# Patient Record
Sex: Female | Born: 1990
Health system: Southern US, Community
[De-identification: ages and names within clinical notes are randomized; demographics above are authoritative.]

## PROBLEM LIST (undated history)

## (undated) DIAGNOSIS — F329 Major depressive disorder, single episode, unspecified: Secondary | ICD-10-CM

## (undated) DIAGNOSIS — F419 Anxiety disorder, unspecified: Secondary | ICD-10-CM

## (undated) DIAGNOSIS — F32A Depression, unspecified: Secondary | ICD-10-CM

## (undated) DIAGNOSIS — N289 Disorder of kidney and ureter, unspecified: Secondary | ICD-10-CM

## (undated) HISTORY — DX: Depression, unspecified: F32.A

## (undated) HISTORY — DX: Anxiety disorder, unspecified: F41.9

## (undated) HISTORY — DX: Major depressive disorder, single episode, unspecified: F32.9

## (undated) HISTORY — PX: WISDOM TOOTH EXTRACTION: SHX21

---
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
## 2010-05-30 NOTE — Discharge Summary (Signed)
NAME:  Barbara Figueroa, Barbara Figueroa                        ACCOUNT NO.:  [id number]   MEDICAL RECORD NO.:  [id number]                   PATIENT TYPE:  INP   LOCATION:  0105                                 FACILITY:  [facility]   PHYSICIAN:  Beverly Milch, MD                  DATE OF BIRTH:  27-Apr-1990   DATE OF ADMISSION:  10/27/2002  DATE OF DISCHARGE:  10/31/2002                                 DISCHARGE SUMMARY   IDENTIFICATION:  A [age]-year-old female, eighth grade student at  [employer] was admitted emergently voluntarily, on referral  from Dr. Milford Cage, for inpatient stabilization of suicide risk and  depression.  The patient had disclosed suicide thoughts such as using a gun  in her home or jumping from a height during her regular appointment on the  day of admission.  The patient was conflicted about being hospitalized and  having to undergo treatment as was the family, though such conflictual  problem solving has not been unique to the current setting.  For full  details please see the typed history and physical.   SYNOPSIS OF THE PRESENT ILLNESS:  The patient describes a stuttering course  of depression, having divorce counseling by Doyne Keel when she was in the  fifth grade.  She is now seeing Dr. Katrinka Blazing for one appointment and it is to  start Lexapro.  The patient suggests that she keeps all of her problems  inside and does not discuss them even with friends, stating there are few  she can really trust.  She seems to trust an older step-brother who has  diabetes.  She has more conflict with step-father than any other parent.  She appears to have a pervasive dissatisfaction and disappointment with  herself on account of her life and her future.  She presents with a pattern  of neurotic dysthymic depression with the question of a superimposed major  depression over the last 1-2 months.  She is on Ortho Tri-Cyclen but does  not describe or manifest definite side  effects including exacerbation of  dysphoria.  She is having frequent crying spells and preoccupation with  suicide.  She has irritable outbursts of anger.  She has been cutting  herself for two years since the 7th grade with a razor.  She has impulse  control difficulties such as fingernail biting, in the past which she has  stopped but continues to pop her knuckles which bothers mother and to chew  on the inside of her lip and mouth.  The mother has been on an  antidepressant for the last two years, according to the patient.  She does  not know which one.  The patient implies that biological father has been  suicidal, at the time of admission in her interview with myself but spends  every other weekend with her father.   INITIAL MENTAL STATUS EXAM:  The patient  initially was irritable and tense.  She wanted out of the hospital and became more agitated over the first 24-48  hours in this regard.  She was severely dysphoric.  Though much of this may  have been anger, and she did not open up or trust to talk to others.  She  also described a typical and hysteroid dysphoric features chronically.  Superimposed major depression was suspected from the intake interview and it  was impossible to get an elaboration from the patient on the various  implications she made about her own suicide symptom origins.   LABORATORY FINDINGS:  CBC, on admission, was normal with a white count of  5100, hemoglobin 13.3, MCV of 92, and platelet count 396,000.  Basic  metabolic panel was normal with a sodium 138, potassium 4.3, glucose 96,  creatinine 0.9 and calcium 9.8.  Hepatic function panel was normal with  total bilirubin 0.7, SGOT 18, SGPT 10 and albumin 3.8.  Free T4 was normal  at 1.12 and TSH at 2.615.  Urine HCG  was negative.  Urine drug screen was  negative.  RPR printed results are pending but provisionally negative.  Urine for GC and CT probes by DNA amplification were negative.   PHYSICAL  EXAMINATION:  GENERAL:  Completed by Sallye Lat, P.A.C.  Patient  acknowledged trying to cut her wrists last year.  She had no significant  abnormalities.  VITAL SIGNS:  Stable throughout hospital stay with admission weight of 129  and discharge weight of 130 pounds with height of 65 inches.  Blood pressure  was 110/61 and heart rate 67 at the time of discharge, after being 120/80  with heart rate of 70 at the time of admission.   HOSPITAL COURSE:  The patient started working seriously in the treatment  program on the third hospital day.  She had alienated mother on the second  hospital day who found the patient disturbing as the patient walked away and  refused to talk to mother any further.  The patient resolved some of this  behavior on the third hospital day including working with her father on the  unit as well as peers.  She became diligent then and looking at the origin  and course of depressive symptoms.  Major depressive symptoms were not  apparent in a sustained fashion once her anger was dissipated and worked  through and the more appropriate final diagnosis is that of dysthymic  disorder and impulse control disorder.  The patient resolved suicide  ideation.  She had much to tell step-father in the final family session,  though mother was motivated for the patient to be discharged to get back to  school as soon as possible, with mother considering the patient's progress  exceptional by the day of discharge.   FINAL DIAGNOSES:   AXIS I:  1. Dysthymic disorder, early onset, severe with atypical features.  2. Impulse control disorder, not otherwise specified.  3. Rule out major depressive single episode with early melancholic features     (provisional diagnosis).  4. Other specified family circumstances.  5. Parent-Child problem.   AXIS II:  Diagnosis deferred.   AXIS III:  1. Lacerations.  2. Birth control pills.  AXIS IV:  Stressors family - severe, predominantly  acute on chronic; phase  of life moderate, predominantly acute.   AXIS V:  1. Global assessment of functioning, at the time of admission, 35 with     highest last year 21 and discharge global assessment  of functioning was     55.   PLAN:  The patient was started on Lexapro 10 mg nightly during her hospital  stay and tolerated this well.  She could see the purpose of Lexapro by the  time of discharge and was committed to continuing it.  She continued her  Ortho Tri-Cyclen.  She was discharged with Lexapro 10 mg every bedtime,  quantity #30, with no refills and continue her Ortho Tri-Cyclen having her  own supply one daily.  Crisis and safety plans are outlined.  The patient  and family understand diagnosis and medication as well as ongoing need for  family therapy.   The patient will see Doyne Keel in this regard with mother to call to  arrange the appointment.  The patient will see Dr. Milford Cage on November 23, 2002 at 1400.  They will call for any interim medication difficulties  especially increased suicide ideation.                                               Beverly Milch, MD    GJ/MEDQ  D:  11/01/2002  T:  11/01/2002  Job:  [id number]   cc:   Jasmine Pang, M.D.  Fax: [phone]   Doyne Keel, Therapist  [place],  [place]

## 2010-05-30 NOTE — H&P (Signed)
NAME:  Barbara Figueroa, Barbara Figueroa                        ACCOUNT NO.:  [id number]   MEDICAL RECORD NO.:  [id number]                   PATIENT TYPE:  INP   LOCATION:  0105                                 FACILITY:  [facility]   PHYSICIAN:  Beverly Milch, MD                  DATE OF BIRTH:  06-27-1990   DATE OF ADMISSION:  10/27/2002  DATE OF DISCHARGE:                         PSYCHIATRIC ADMISSION ASSESSMENT   REPORT TITLE:  ADOLESCENT PSYCHIATRIC ADMISSION ASSESSMENT   IDENTIFICATION:  This is a 20-year-old female, eighth grade student at  [employer], who was admitted emergently voluntarily on referral  from Dr. Milford Cage for inpatient stabilization of suicide risk and  depression.  The patient acknowledged to Dr. Katrinka Blazing today that she has had  frequent and intense thoughts of suicide lately including a mechanism such  as using a gun which is in the home or jumping from a height.  The patient  is on no medications at the time of admission.   SYNOPSIS OF PRESENT ILLNESS:  The patient has a stuttering course of  depression that appears to likely represent a major depression superimposed  on a dysthymic disorder.  She also has some impulse control difficulties.  She reportedly had some divorce counseling by [employer] when she was in  the fifth grade.  She has seen Dr. Katrinka Blazing now for one appointment and Dr.  Katrinka Blazing intends to start Lexapro today.   The patient reports that her friends just treat her like an average person  although she states she does not open up and talk with anyone except people  that she can really trust who are very few.  Mother and Dr. Katrinka Blazing have been  concerned.  The patient has been cutting herself for the last two years  since the seventh grade including with a razor to the upper thigh, left  wrist, and lateral legs recently.  The patient is said to have mood swings  sometimes.  She is irritable and has easy outbursts of anger.  She throws  things at  times.  She states that she feels better when she eats and has  always used eating as a way to satiate her emotional needs.   She is crying frequently.  She has more recently had preoccupation with  suicide.  She has been much more dysphoric.  The family and professionals  are aware though the patient suggests that her friends still cannot tell.  She did not acknowledge any definite manic symptoms.  The patient has had no  psychotic symptoms.  The patient currently has specific suicide plans;  however, she does not open up and talk to many people about this, if any.   She has a significant family history of depression.  The patient will not  open up and talk much at the hospital and states she does not want to be  here.  The patient seems to feel  a sense of loss of an 21 year old brother  who may have recently moved out.  Apparently, mother has remarried and the  parents originally divorced with the patient implying that her biological  father committed suicide.  Mother is on antidepressant medication.  The  patient is on Ortho-Tri-Cyclen but does not describe or suspect that it has  exacerbated the depression.   The patient's last menses was one week ago.  The patient states she has  tried a sip of alcohol of mother's once and has smoked one cigarette.  She  has not used drugs, alcohol, or other substances of abuse, otherwise.  The  patient denies specific anxiety.  She has no psychotic symptoms.  The  patient does not acknowledge fire setting or stealing.  She does not  acknowledge hypersexuality or expansive behavior.  She has had no psychotic  symptoms.  The patient denies other trauma.   PAST MEDICAL HISTORY:  The patient is on Ortho-Tri-Cyclen and suggests that  her last GYN appointment was three months ago.  Her last menses was one week  ago.  She suggests she is, otherwise, in good general health.  She used to  bite her fingernails excessively but does not currently.  However,  she pops  her knuckles excessively which bothers mother.  She also chews on the inside  of her lip or mouth.  The patient is, otherwise, in reportedly good general  health.  The patient does not acknowledge any history of seizures or  syncope.  She has had no heart murmur or arrhythmia.  She has had no organic  central nervous system trauma.   ALLERGIES:  She has no medication allergies.   REVIEW OF SYSTEMS:  The patient denies difficulty with gait, gaze, or  continence.  She denies exposure to communicable disease or toxins.  She  denies rash, jaundice, or purpura.  There is no chest pain, palpitations or  presyncope.  There is no abdominal pain, nausea, vomiting, or diarrhea.  There is no dysuria or arthralgia.   IMMUNIZATIONS:  Are up-to-date.   FAMILY HISTORY:  The patient and mother provide little family history.  They  suggest there is an 34 year old brother who may have moved out recently  which the patient considered traumatic.  The patient apparently resides with  mother and stepfather.  The biological parents are divorced and the patient  implies that her biological father committed suicide.  She would not give  other details.  Mother has been on an antidepressant for the last two years  according to the patient but she is not certain which one.  Dr. Katrinka Blazing has  recommended Lexapro for the patient.   SOCIAL/DEVELOPMENTAL HISTORY:  There are not stated complications or  consequences of gestation, delivery, or neonatal period.  There have been no  learning delays. The patient reports that she has no effect at all from  caffeine, cold or cough medications.  She has no untoward reactions,  otherwise.  She does not smoke cigarettes except for one in the past. She  does not acknowledge purging and has no contraindication to antidepressant  treatment.   ASSETS:  The patient is intellectually capable of benefitting from  treatment.  MENTAL STATUS EXAMINATION:  The patient's  height is 65 inches and weight 129  pounds, blood pressure 120/80 and heart rate of 70.  She is alert and  oriented with speech intact.  Cranial nerves II-XII are intact.  The deep  tendon reflexes and AMRs are 0/0.  Muscle strength and tone are normal.  There are no abnormal involuntary movements.  There are no neurological soft  signs.  The patient is severely dysphoric.  She appears to attempt to hold  back tears throughout the interview.  She is guarded and defensive and  states she does not trust to talk to others.  She is somewhat irritable,  tense, and agitated.  She has chronic atypical and hysteroid dysphoric  features.  She has new onset of major depressive features.  She is currently  hopeless and helpless.  She denies anxiety.  She has impulse control  difficulties.  She does not present psychotic or dissociative features.  She  has no definite manic symptoms or hypomanic symptoms.  Her capacity for  insight and judgment is fair to poor at this time.  She has active suicidal  ideation and plan but states she can contract safety although she does not  trust anyone to talk about her problems.  She is not homicidal or  assaultive.   IMPRESSION:   AXIS I:  1. Major depression, single episode, severe with melancholic features.  2. Dysthymic disorder, early onset, moderate severity with atypical     features.  3. Impulse control disorder; not otherwise specified.  4. Other specified family circumstances.  5. Parent/child problem.   AXIS II:  Deferred.   AXIS III:  1. Lacerations.  2. Birth control pills.   AXIS IV:  Stressors:  Family:  Severe, predominantly acute and chronic.  Phase of Life:  Moderate, predominantly acute.   AXIS V:  1. Current GAF:  35.  2. Highest GAF in the last year:  84   PLAN:  The patient is admitted for inpatient adolescent psychiatric and  multidisciplinary multimodal behavioral treatment in a team based program at  a locked psychiatric  unit.  We will proceed with Lexapro per Dr. Michaelle Copas  plan.  Okay for Ortho-Tri-Cyclen at this time but will continue to monitor  and reconsider if necessary if any side effects from Ortho-Tri-Cyclen  particularly from mood or suspected.  Cognitive behavioral and family  therapy are planned.   LENGTH OF STAY:  Estimated at 5-7 days with target goals at the time of  discharge being stabilization of suicide risk and depression, restoration of  containment and communication for safety and generalization of her capacity  for safe effective outpatient treatment.                                               Beverly Milch, MD    GJ/MEDQ  D:  10/27/2002  T:  10/29/2002  Job:  [id number]

## 2013-09-11 ENCOUNTER — Encounter: Payer: Self-pay | Admitting: Obstetrics and Gynecology

## 2013-09-11 ENCOUNTER — Ambulatory Visit (INDEPENDENT_AMBULATORY_CARE_PROVIDER_SITE_OTHER): Payer: No Typology Code available for payment source | Admitting: Obstetrics and Gynecology

## 2013-09-11 VITALS — BP 100/70 | HR 76 | Resp 18 | Ht 66.0 in | Wt 146.2 lb

## 2013-09-11 DIAGNOSIS — N644 Mastodynia: Secondary | ICD-10-CM

## 2013-09-11 DIAGNOSIS — N943 Premenstrual tension syndrome: Secondary | ICD-10-CM

## 2013-09-11 MED ORDER — DROSPIRENONE-ETHINYL ESTRADIOL 3-0.02 MG PO TABS: 1.0000 | ORAL_TABLET | Freq: Every day | ORAL | Status: DC

## 2013-09-11 NOTE — Patient Instructions (Signed)
Drospirenone; Ethinyl Estradiol tablets What is this medicine? DROSPIRENONE; ETHINYL ESTRADIOL (dro SPY re nown; ETH in il es tra DYE ole) is an oral contraceptive (birth control pill). This medicine combines two types of female hormones, an estrogen and a progestin. It is used to prevent ovulation and pregnancy. This medicine may be used for other purposes; ask your health care provider or pharmacist if you have questions. COMMON BRAND NAME(S): Gianvi, Loryna, Nikki 28-Day, Ocella, Syeda, Vestura, Yasmin, Yaz, Zarah What should I tell my health care provider before I take this medicine? They need to know if you have or ever had any of these conditions: -abnormal vaginal bleeding -adrenal gland disease -blood vessel disease or blood clots -breast, cervical, endometrial, ovarian, liver, or uterine cancer -diabetes -gallbladder disease -heart disease or recent heart attack -high blood pressure -high cholesterol -high potassium level -kidney disease -liver disease -migraine headaches -stroke -systemic lupus erythematosus (SLE) -tobacco smoker -an unusual or allergic reaction to estrogens, progestins, or other medicines, foods, dyes, or preservatives -pregnant or trying to get pregnant -breast-feeding How should I use this medicine? Take this medicine by mouth. To reduce nausea, this medicine may be taken with food. Follow the directions on the prescription label. Take this medicine at the same time each day and in the order directed on the package. Do not take your medicine more often than directed. A patient package insert for the product will be given with each prescription and refill. Read this sheet carefully each time. The sheet may change frequently. Talk to your pediatrician regarding the use of this medicine in children. Special care may be needed. This medicine has been used in female children who have started having menstrual periods. Overdosage: If you think you have taken too  much of this medicine contact a poison control center or emergency room at once. NOTE: This medicine is only for you. Do not share this medicine with others. What if I miss a dose? If you miss a dose, refer to the patient information sheet you received with your medicine for direction. If you miss more than one pill, this medicine may not be as effective and you may need to use another form of birth control. What may interact with this medicine? -acetaminophen -antibiotics or medicines for infections, especially rifampin, rifabutin, rifapentine, and griseofulvin, and possibly penicillins or tetracyclines -aprepitant -ascorbic acid (vitamin C) -atorvastatin -barbiturate medicines, such as phenobarbital -bosentan -carbamazepine -caffeine -clofibrate -cyclosporine -dantrolene -doxercalciferol -felbamate -grapefruit juice -hydrocortisone -medicines for anxiety or sleeping problems, such as diazepam or temazepam -medicines for diabetes, including pioglitazone -mineral oil -modafinil -mycophenolate -nefazodone -oxcarbazepine -phenytoin -prednisolone -ritonavir or other medicines for HIV infection or AIDS -rosuvastatin -selegiline -soy isoflavones supplements -St. John's wort -tamoxifen or raloxifene -theophylline -thyroid hormones -topiramate -warfarin This product is different from other birth control pills because it contains the progestin drospirenone. Drospirenone may increase potassium levels. Interactions with other drugs may increase the chance of an elevated potassium level. You may need blood tests to check your potassium level. Drugs that can increase the potassium level include: -certain medications for high blood pressure or heart conditions (examples include ACE-inhibitors and also Angiotensin-II receptor blockers, and Eplerenone -dietary salt substitutes (these may contain potassium) -heparin -NSAIDs (antiinflammatory drugs), if they are taken long-term and daily,  like for arthritis -potassium supplements -some 'water pills' (diuretics like amiloride, spironolactone or triamterene) This list may not describe all possible interactions. Give your health care provider a list of all the medicines, herbs, non-prescription drugs, or dietary supplements   you use. Also tell them if you smoke, drink alcohol, or use illegal drugs. Some items may interact with your medicine. What should I watch for while using this medicine? Visit your doctor or health care professional for regular checks on your progress. You will need a regular breast and pelvic exam and Pap smear while on this medicine. Use an additional method of contraception during the first cycle that you take these tablets. If you have any reason to think you are pregnant, stop taking this medicine right away and contact your doctor or health care professional. If you are taking this medicine for hormone related problems, it may take several cycles of use to see improvement in your condition. Smoking increases the risk of getting a blood clot or having a stroke while you are taking birth control pills, especially if you are more than 24 years old. You are strongly advised not to smoke. This medicine can make your body retain fluid, making your fingers, hands, or ankles swell. Your blood pressure can go up. Contact your doctor or health care professional if you feel you are retaining fluid. This medicine can make you more sensitive to the sun. Keep out of the sun. If you cannot avoid being in the sun, wear protective clothing and use sunscreen. Do not use sun lamps or tanning beds/booths. If you wear contact lenses and notice visual changes, or if the lenses begin to feel uncomfortable, consult your eye care specialist. In some women, tenderness, swelling, or minor bleeding of the gums may occur. Notify your dentist if this happens. Brushing and flossing your teeth regularly may help limit this. See your dentist  regularly and inform your dentist of the medicines you are taking. If you are going to have elective surgery, you may need to stop taking this medicine before the surgery. Consult your health care professional for advice. This medicine does not protect you against HIV infection (AIDS) or any other sexually transmitted diseases. What side effects may I notice from receiving this medicine? Side effects that you should report to your doctor or health care professional as soon as possible: -allergic reactions like skin rash, itching or hives, swelling of the face, lips, or tongue -breast tissue changes or discharge -changes in vision -chest pain -confusion, trouble speaking or understanding -dark urine -general ill feeling or flu-like symptoms -light-colored stools -nausea, vomiting -pain, swelling, warmth in the leg -right upper belly pain -severe headaches -shortness of breath -sudden numbness or weakness of the face, arm or leg -trouble walking, dizziness, loss of balance or coordination -unusual vaginal bleeding -yellowing of the eyes or skin Side effects that usually do not require medical attention (report to your doctor or health care professional if they continue or are bothersome): -acne -brown spots on the face -change in appetite -change in sexual desire -depressed mood or mood swings -fluid retention and swelling -stomach cramps or bloating -unusually weak or tired -weight gain This list may not describe all possible side effects. Call your doctor for medical advice about side effects. You may report side effects to FDA at 1-800-FDA-1088. Where should I keep my medicine? Keep out of the reach of children. Store at room temperature between 15 and 30 degrees C (59 and 86 degrees F). Throw away any unused medicine after the expiration date. NOTE: This sheet is a summary. It may not cover all possible information. If you have questions about this medicine, talk to your doctor,  pharmacist, or health care provider.  2015, [employer]vier/[employer]   Standard. (2007-12-15 13:02:54)  

## 2013-09-11 NOTE — Progress Notes (Signed)
Patient ID: Barbara Figueroa, female   DOB: 12-26-1990, 23 y.o.   MRN: [id number] GYNECOLOGY VISIT  PCP:   None  Referring provider:   HPI: 23 y.o.  Single Caucasian  female   G0P0 with Patient's last menstrual period was 08/22/2013.   here for  Right Breast pain/possible lump. Woke up three days with swollen and painful breast.  Felt warm to touch.  Had a rash on her chest and it looks like it had a break out.  Resolved now.  Felt like there was knot under the breast.  Areola was pale and hurt to lift right arm. No trauma.  No prior breast problems. No nipple discharge.   Had an ultrasound of the right breast 5 - 6 years ago and was diagnosed with fibrocystic change.   No hormonal contraceptive changes.  Wants to switch to a new pill. Had been on this pill since January and now having more PMS symptoms.  Has used Yaz in the past. Worried about cystic acne.   Menses are occurring every 20 days and last 2 1/2 - 3 days.  No skipped pills "in a long time."  GYNECOLOGIC HISTORY: Patient's last menstrual period was 08/22/2013. Sexually active:  yes Partner preference: female Contraception: OCP's--Aubra   Menopausal hormone therapy: n/a DES exposure:  no  Blood transfusions:   no Sexually transmitted diseases:   no GYN procedures and prior surgeries:  no Last mammogram:   n/a              Last pap and high risk HPV testing:   12/2012 wnl History of abnormal pap smear:  no   OB History   Grav Para Term Preterm Abortions TAB SAB Ect Mult Living   0                LIFESTYLE: Exercise:   Walking and jogging            Tobacco:   no Alcohol:   1 drink per month Drug use:  no  There are no active problems to display for this patient.   Past Medical History  Diagnosis Date  . Depression     Past Surgical History  Procedure Laterality Date  . Wisdom tooth extraction      Current Outpatient Prescriptions  Medication Sig Dispense Refill  . levonorgestrel-ethinyl  estradiol (AVIANE,ALESSE,LESSINA) 0.1-20 MG-MCG tablet Take 1 tablet by mouth daily.       No current facility-administered medications for this visit.     ALLERGIES: Review of patient's allergies indicates no known allergies.  Family History  Problem Relation Age of Onset  . Breast cancer Maternal Grandmother   . Breast cancer Paternal Grandmother   . Colon cancer Paternal Grandmother     History   Social History  . Marital Status: Married    Spouse Name: N/A    Number of Children: N/A  . Years of Education: N/A   Occupational History  . Not on file.   Social History Main Topics  . Smoking status: Never Smoker   . Smokeless tobacco: Not on file  . Alcohol Use: Yes     Comment: 1 drink per month  . Drug Use: No  . Sexual Activity: Yes    Partners: Male    Birth Control/ Protection: OCP     Comment: Barbara Figueroa   Other Topics Concern  . Not on file   Social History Narrative  . No narrative on file    ROS:  Pertinent  items are noted in HPI.  PHYSICAL EXAMINATION:    BP 100/70  Pulse 76  Resp 18  Ht  (1.676 m)  Wt 146 lb 3.2 oz (66.316 kg)  BMI 23.61 kg/m2  LMP 08/22/2013   Wt Readings from Last 3 Encounters:  09/11/13 146 lb 3.2 oz (66.316 kg)     Ht Readings from Last 3 Encounters:  09/11/13  (1.676 m)    General appearance: alert, cooperative and appears stated age Head: Normocephalic, without obvious abnormality, atraumatic Lungs: clear to auscultation bilaterally Breasts: Inspection negative, No nipple retraction or dimpling, No nipple discharge or bleeding, No axillary or supraclavicular adenopathy, Normal to palpation without dominant masses Heart: regular rate and rhythm Abdomen: soft, non-tender; no masses,  no organomegaly   No abnormal inguinal nodes palpated Neurologic: Grossly normal  Pelvic:  Not needed.  ASSESSMENT  Right mastalgia.  I suspect a reaction from an insect bite.  PMS. Breakthrough bleeding on  OCPs.  PLAN  No breast imaging needed.  Switch OCPs to Yaz.  1 pack and 2 refills.  Precautions given regarding cardiovascular events.  Instructed in use.  Recheck in 3 months.  Patient will check to see when she is due for her annual exam.   An After Visit Summary was printed and given to the patient.

## 2013-12-13 ENCOUNTER — Ambulatory Visit (INDEPENDENT_AMBULATORY_CARE_PROVIDER_SITE_OTHER): Payer: No Typology Code available for payment source | Admitting: Obstetrics and Gynecology

## 2013-12-13 ENCOUNTER — Encounter: Payer: Self-pay | Admitting: Obstetrics and Gynecology

## 2013-12-13 VITALS — BP 110/68 | HR 70 | Resp 18 | Ht 66.0 in | Wt 144.0 lb

## 2013-12-13 DIAGNOSIS — Z3041 Encounter for surveillance of contraceptive pills: Secondary | ICD-10-CM

## 2013-12-13 DIAGNOSIS — N644 Mastodynia: Secondary | ICD-10-CM

## 2013-12-13 MED ORDER — DROSPIRENONE-ETHINYL ESTRADIOL 3-0.02 MG PO TABS: 1.0000 | ORAL_TABLET | Freq: Every day | ORAL | Status: DC

## 2013-12-13 NOTE — Patient Instructions (Signed)
I will see your for your annual exam in February or March!

## 2013-12-13 NOTE — Progress Notes (Signed)
Patient ID: Barbara Figueroa, female   DOB: 04-Jun-1990, 23 y.o.   MRN: [id number] GYNECOLOGY  VISIT   HPI: 23 y.o.   Married  Caucasian  female   G0P0 with Patient's last menstrual period was 11/26/2013.   here for a 3 months recheck on OCPs.  Taking Yaz for birth control and PMS symptoms.  Menses regular and monthly.  Remembers pills.  Acne improved.   Right breast is swelling right before menses.  Not as painful as it was before.  Looks red to patient.  Had a breast ultrasound 5 - 6 years ago.   GYNECOLOGIC HISTORY: Patient's last menstrual period was 11/26/2013. Contraception:    Menopausal hormone therapy:         OB History    Gravida Para Term Preterm AB TAB SAB Ectopic Multiple Living   0                  There are no active problems to display for this patient.   Past Medical History  Diagnosis Date  . Depression     Past Surgical History  Procedure Laterality Date  . Wisdom tooth extraction      Current Outpatient Prescriptions  Medication Sig Dispense Refill  . drospirenone-ethinyl estradiol (YAZ,GIANVI,LORYNA) 3-0.02 MG tablet Take 1 tablet by mouth daily. 1 Package 2   No current facility-administered medications for this visit.     ALLERGIES: Review of patient's allergies indicates no known allergies.  Family History  Problem Relation Age of Onset  . Breast cancer Maternal Grandmother   . Breast cancer Paternal Grandmother   . Colon cancer Paternal Grandmother     History   Social History  . Marital Status: Married    Spouse Name: N/A    Number of Children: N/A  . Years of Education: N/A   Occupational History  . Not on file.   Social History Main Topics  . Smoking status: Never Smoker   . Smokeless tobacco: Not on file  . Alcohol Use: Yes     Comment: 1 drink per month  . Drug Use: No  . Sexual Activity:    Partners: Male    Birth Control/ Protection: OCP     Comment: [name]   Other Topics Concern  . Not on file   Social  History Narrative    ROS:  Pertinent items are noted in HPI.  PHYSICAL EXAMINATION:    BP 110/68 mmHg  Pulse 70  Resp 18  Ht 5\' 6"  (1.676 m)  Wt 144 lb (65.318 kg)  BMI 23.25 kg/m2  LMP 11/26/2013     Breasts - no dominant masses, retractions, nipple discharge, or axillary adenopathy.   ASSESSMENT  Right breast pain.  Hx fibrocystic change.  OCP surveillance.  Doing well on Yaz.  PLAN  Right breast ultrasound.  Refill on Yaz for 4 months.  Annual exam in 2 -3 months.    An After Visit Summary was printed and given to the patient.  _15_____ minutes face to face time of which over 50% was spent in counseling.

## 2013-12-13 NOTE — Progress Notes (Signed)
Patient is scheduled for R Breast Ultrasound at [facility] imaging on 12/15/13 at 1500 . Patient agreeable to time/date/location.

## 2013-12-15 ENCOUNTER — Ambulatory Visit
Admission: RE | Admit: 2013-12-15 | Discharge: 2013-12-15 | Disposition: A | Discharge status: Home / Self Care | Payer: No Typology Code available for payment source | Source: Ambulatory Visit | Attending: Obstetrics and Gynecology | Admitting: Obstetrics and Gynecology

## 2013-12-15 DIAGNOSIS — N644 Mastodynia: Secondary | ICD-10-CM

## 2013-12-19 ENCOUNTER — Telehealth: Payer: Self-pay | Admitting: Emergency Medicine

## 2013-12-19 NOTE — Telephone Encounter (Signed)
-----   Message from [place] E Amundson de Gwenevere Ghaziarvalho E Silva, MD sent at 12/17/2013 10:21 AM EST ----- Please contact patient regarding normal right breast ultrasound and remove from mammogram hold.   Right breast pain can be managed with ibuprofen 600 mg by mouth every 8 hours.  Vitamin E 400 - 600 IU daily can also be helpful.  Continue self breast exam and return for usual annual exam and for any detected mass.  Thanks.

## 2013-12-19 NOTE — Telephone Encounter (Signed)
Patient given message from Dr. Edward JollySilva. Verbalizes understanding of instructions and when to follow up. Will try otc treatments, continue self breast exams and follow up as scheduled.  Routing to provider for final review. Patient agreeable to disposition. Will close encounter

## 2014-01-18 ENCOUNTER — Telehealth: Payer: Self-pay | Admitting: Obstetrics and Gynecology

## 2014-01-18 NOTE — Telephone Encounter (Signed)
12/21/13 #1 pack with 3 rfs [facility] Called patient she states that at the bottom of her bottle it states that it needs a MD authorization for next refill, she has a little over a week left but she just wanted to make sure her refills were okay for her to pick up. [facility] and s/w Lurena JoinerRebecca she said her next refill has been authorized and patient can pick up when ready. Called patient and notified her of this she is aware.  Routed to provider for review, encounter closed.

## 2014-01-18 NOTE — Telephone Encounter (Signed)
Patient wants to discuss her medication with the nurse. °

## 2014-02-13 ENCOUNTER — Telehealth: Payer: Self-pay | Admitting: Obstetrics and Gynecology

## 2014-02-13 NOTE — Telephone Encounter (Signed)
OK for [place] for one month.  No refills.  Patient is overdue for annual exam on chart review.  Please schedule appointment.   Thanks!

## 2014-02-13 NOTE — Telephone Encounter (Signed)
Patient wants to switch her birth control.  [facility] [address]

## 2014-02-13 NOTE — Telephone Encounter (Signed)
Patient had insurance change and Barbara Figueroa is now tier 3 on her medication coverage. Patient is not having any problems but not able to pay for tier 3 medications.  Patient states that her last brand of pills, Barbara Figueroa, is covered for her as a tier 1 medication. She has used that before without issue per patient.  Advised would send her request to Dr. Edward JollySilva for review and order if agreeable. Will return call with response from Dr. Edward JollySilva. Patient agreeable.

## 2014-02-14 MED ORDER — LEVONORGESTREL-ETHINYL ESTRAD 0.1-20 MG-MCG PO TABS: 1.0000 | ORAL_TABLET | Freq: Every day | ORAL | Status: DC

## 2014-02-14 NOTE — Telephone Encounter (Signed)
Rx for one Month of Barbara Figueroa to pharmacy. Patient has follow up appointment with Dr. Edward JollySilva 03/14/14. Will offer annual exam appointment scheduling with return call.   Call to patient, unable to leave message.

## 2014-02-14 NOTE — Telephone Encounter (Signed)
[facility]. French Anaracy unavailable and asked me to relay previous message to patient.she was agreeable to refill and will keep appt on 3/2 for 47mo recheck. last aex was 09/11/13 so she is scheduled for 9/1 with ms debbi since dr Edward Jollysilva doesnt have availability. Pt agreeable to all of this. Notified French Anaracy and ok to close encounter.

## 2014-03-14 ENCOUNTER — Ambulatory Visit (INDEPENDENT_AMBULATORY_CARE_PROVIDER_SITE_OTHER): Payer: 59 | Admitting: Obstetrics and Gynecology

## 2014-03-14 ENCOUNTER — Encounter: Payer: Self-pay | Admitting: Obstetrics and Gynecology

## 2014-03-14 VITALS — BP 100/70 | HR 72 | Resp 16 | Ht 66.0 in | Wt 150.0 lb

## 2014-03-14 DIAGNOSIS — Z113 Encounter for screening for infections with a predominantly sexual mode of transmission: Secondary | ICD-10-CM

## 2014-03-14 DIAGNOSIS — Z01419 Encounter for gynecological examination (general) (routine) without abnormal findings: Secondary | ICD-10-CM

## 2014-03-14 MED ORDER — LEVONORGESTREL-ETHINYL ESTRAD 0.1-20 MG-MCG PO TABS: 1.0000 | ORAL_TABLET | Freq: Every day | ORAL | Status: DC

## 2014-03-14 NOTE — Patient Instructions (Signed)

## 2014-03-14 NOTE — Progress Notes (Signed)
GYNECOLOGY  VISIT   HPI: 24 y.o.   Single  Caucasian  female   G0P0 with Patient's last menstrual period was 02/26/2014.   here for   Breast check. OCP Follow up.  Also due for annual exam.  Patient is now on a different OCP.  Went back to [place] about one month ago. No problems.  Right breast pain resolved.  No redness. Had a breast ultrasound 5 - 6 years ago.   Sexually active.  Desires sexually transmitted disease testing.  Uses condoms most of the time.   Exercise:  Yoga periodically.   GYNECOLOGIC HISTORY: Patient's last menstrual period was 02/26/2014. Contraception: [name] OCP   Menopausal hormone therapy: none Last pap and high risk HPV testing: 12/2012 wnl       Gardisil completed age 49 or 22. Tetanus many years ago.  Will do through PCP.   OB History    Gravida Para Term Preterm AB TAB SAB Ectopic Multiple Living   0                  There are no active problems to display for this patient.   Past Medical History  Diagnosis Date  . Depression     Past Surgical History  Procedure Laterality Date  . Wisdom tooth extraction      Current Outpatient Prescriptions  Medication Sig Dispense Refill  . levonorgestrel-ethinyl estradiol (AUBRA) 0.1-20 MG-MCG tablet Take 1 tablet by mouth daily. 1 Package 0  . lisdexamfetamine (VYVANSE) 20 MG capsule Take 20 mg by mouth daily.     No current facility-administered medications for this visit.     ALLERGIES: Review of patient's allergies indicates no known allergies.  Family History  Problem Relation Age of Onset  . Breast cancer Maternal Grandmother   . Breast cancer Paternal Grandmother   . Colon cancer Paternal Grandmother     History   Social History  . Marital Status: Single    Spouse Name: N/A  . Number of Children: N/A  . Years of Education: N/A   Occupational History  . Not on file.   Social History Main Topics  . Smoking status: Never Smoker   . Smokeless tobacco: Not on file  .  Alcohol Use: Yes     Comment: 1 drink per month  . Drug Use: No  . Sexual Activity:    Partners: Male    Birth Control/ Protection: OCP     Comment: [name]   Other Topics Concern  . Not on file   Social History Narrative    ROS:  Pertinent items are noted in HPI.  PHYSICAL EXAMINATION:    BP 100/70 mmHg  Pulse 72  Resp 16  Ht  (1.676 m)  Wt 150 lb (68.04 kg)  BMI 24.22 kg/m2  LMP 02/26/2014     General appearance: alert, cooperative and appears stated age Lungs: clear to auscultation bilaterally Heart: regular rate and rhythm Abdomen: soft, non-tender; no masses,  no organomegaly Breasts:  No dominant masses, retractions, nipple discharge, or axillary adenopathy.  No abnormal inguinal nodes palpated  Pelvic: External genitalia:  no lesions              Urethra:  normal appearing urethra with no masses, tenderness or lesions              Bartholins and Skenes: normal                 Vagina: normal appearing vagina with normal  color and discharge, no lesions              Cervix: normal appearance                   Bimanual Exam:  Uterus:  uterus is normal size, shape, consistency and nontender                                      Adnexa: normal adnexa in size, nontender and no masses                                         ASSESSMENT  Normal annual exam.  Need for contraception.  May be due for TDap.  PLAN  Refills on Aubra. 3 months with 3 refills.  Pap not needed this year.  Encouraged condom use. No STD panel here today.  Insurance encourages visits with PCP. No TDap today.  Insurance encourages visits with PCP.  Follow up yearly and prn.     An After Visit Summary was printed and given to the patient.

## 2014-09-13 ENCOUNTER — Ambulatory Visit: Payer: No Typology Code available for payment source | Admitting: Certified Nurse Midwife

## 2015-02-06 ENCOUNTER — Other Ambulatory Visit: Payer: Self-pay | Admitting: Obstetrics and Gynecology

## 2015-02-06 MED ORDER — LEVONORGESTREL-ETHINYL ESTRAD 0.1-20 MG-MCG PO TABS: 1.0000 | ORAL_TABLET | Freq: Every day | ORAL | Status: DC

## 2015-02-06 NOTE — Telephone Encounter (Signed)
Medication refill request: Barbara Figueroa Last AEX:  03/14/2014 Dr. Edward Jolly Next AEX: Not scheduled Last MMG (if hormonal medication request): 12/15/2013 USG BIRADS Category 2 Benign   Refill authorized: 03/14/2014 #3 package 3 Refills  Today:#3 package 3 Refills? Please advise

## 2015-02-06 NOTE — Telephone Encounter (Signed)
Patient requesting refill on her birth control sent to [facility] on [address] at [phone].

## 2015-05-14 ENCOUNTER — Other Ambulatory Visit: Payer: Self-pay | Admitting: Certified Nurse Midwife

## 2015-05-14 MED ORDER — LEVONORGESTREL-ETHINYL ESTRAD 0.1-20 MG-MCG PO TABS: 1.0000 | ORAL_TABLET | Freq: Every day | ORAL | Status: DC

## 2015-05-14 NOTE — Telephone Encounter (Signed)
Medication refill request: OCP Last AEX:  03/14/14 Dr. Edward JollySilva Next AEX: 06/26/15 DL Last MMG (if hormonal medication request): None Refill authorized: 02/06/15 #3packs/0R. Today #3pack/0R?

## 2015-05-14 NOTE — Telephone Encounter (Signed)
Patient called requesting refills on her birth control. Pharmacy on file is correct.

## 2015-05-15 NOTE — Telephone Encounter (Signed)
Patient aware rx has been sent to pharmacy 

## 2015-06-26 ENCOUNTER — Encounter: Payer: Self-pay | Admitting: Certified Nurse Midwife

## 2015-06-26 ENCOUNTER — Ambulatory Visit (INDEPENDENT_AMBULATORY_CARE_PROVIDER_SITE_OTHER): Payer: BLUE CROSS/BLUE SHIELD | Admitting: Certified Nurse Midwife

## 2015-06-26 VITALS — BP 104/70 | HR 72 | Resp 16 | Ht 66.25 in | Wt 175.0 lb

## 2015-06-26 DIAGNOSIS — Z23 Encounter for immunization: Secondary | ICD-10-CM | POA: Diagnosis not present

## 2015-06-26 DIAGNOSIS — Z01419 Encounter for gynecological examination (general) (routine) without abnormal findings: Secondary | ICD-10-CM

## 2015-06-26 DIAGNOSIS — Z3041 Encounter for surveillance of contraceptive pills: Secondary | ICD-10-CM

## 2015-06-26 DIAGNOSIS — Z124 Encounter for screening for malignant neoplasm of cervix: Secondary | ICD-10-CM

## 2015-06-26 MED ORDER — LEVONORGESTREL-ETHINYL ESTRAD 0.1-20 MG-MCG PO TABS: 1.0000 | ORAL_TABLET | Freq: Every day | ORAL | Status: DC

## 2015-06-26 NOTE — Progress Notes (Signed)
Reviewed personally.  M. Suzanne Camary Sosa, MD.  

## 2015-06-26 NOTE — Addendum Note (Signed)
Addended by: Michell Heinrich'NEAL, SUMMER D on: 06/26/2015 11:12 AM   Modules accepted: Kipp BroodSmartSet

## 2015-06-26 NOTE — Patient Instructions (Signed)
General topics  Next pap or exam is  due in 1 year Take a Women's multivitamin Take 1200 mg. of calcium daily - prefer dietary If any concerns in interim to call back  Breast Self-Awareness Practicing breast self-awareness may pick up problems early, prevent significant medical complications, and possibly save your life. By practicing breast self-awareness, you can become familiar with how your breasts look and feel and if your breasts are changing. This allows you to notice changes early. It can also offer you some reassurance that your breast health is good. One way to learn what is normal for your breasts and whether your breasts are changing is to do a breast self-exam. If you find a lump or something that was not present in the past, it is best to contact your caregiver right away. Other findings that should be evaluated by your caregiver include nipple discharge, especially if it is bloody; skin changes or reddening; areas where the skin seems to be pulled in (retracted); or new lumps and bumps. Breast pain is seldom associated with cancer (malignancy), but should also be evaluated by a caregiver. BREAST SELF-EXAM The best time to examine your breasts is 5 7 days after your menstrual period is over.  ExitCare Patient Information 2013 [employer].   Exercise to Stay Healthy Exercise helps you become and stay healthy. EXERCISE IDEAS AND TIPS Choose exercises that:  You enjoy.  Fit into your day. You do not need to exercise really hard to be healthy. You can do exercises at a slow or medium level and stay healthy. You can:  Stretch before and after working out.  Try yoga, Pilates, or tai chi.  Lift weights.  Walk fast, swim, jog, run, climb stairs, bicycle, dance, or rollerskate.  Take aerobic classes. Exercises that burn about 150 calories:  Running 1  miles in 15 minutes.  Playing volleyball for 45 to 60 minutes.  Washing and waxing a car for 45 to 60  minutes.  Playing touch football for 45 minutes.  Walking 1  miles in 35 minutes.  Pushing a stroller 1  miles in 30 minutes.  Playing basketball for 30 minutes.  Raking leaves for 30 minutes.  Bicycling 5 miles in 30 minutes.  Walking 2 miles in 30 minutes.  Dancing for 30 minutes.  Shoveling snow for 15 minutes.  Swimming laps for 20 minutes.  Walking up stairs for 15 minutes.  Bicycling 4 miles in 15 minutes.  Gardening for 30 to 45 minutes.  Jumping rope for 15 minutes.  Washing windows or floors for 45 to 60 minutes. Document Released: 01/31/2010 Document Revised: 03/23/2011 Document Reviewed: 01/31/2010 ExitCare Patient Information 2013 [employer]Care[employer].   Other topics ( that may be useful information):    Sexually Transmitted Disease Sexually transmitted disease (STD) refers to any infection that is passed from person to person during sexual activity. This may happen by way of saliva, semen, blood, vaginal mucus, or urine. Common STDs include:  Gonorrhea.  Chlamydia.  Syphilis.  HIV/AIDS.  Genital herpes.  Hepatitis B and C.  Trichomonas.  Human papillomavirus (HPV).  Pubic lice. CAUSES  An STD may be spread by bacteria, virus, or parasite. A person can get an STD by:  Sexual intercourse with an infected person.  Sharing sex toys with an infected person.  Sharing needles with an infected person.  Having intimate contact with the genitals, mouth, or rectal areas of an infected person. SYMPTOMS  Some people may not have any symptoms, but   they can still pass the infection to others. Different STDs have different symptoms. Symptoms include:  Painful or bloody urination.  Pain in the pelvis, abdomen, vagina, anus, throat, or eyes.  Skin rash, itching, irritation, growths, or sores (lesions). These usually occur in the genital or anal area.  Abnormal vaginal discharge.  Penile discharge in men.  Soft, flesh-colored skin growths in the  genital or anal area.  Fever.  Pain or bleeding during sexual intercourse.  Swollen glands in the groin area.  Yellow skin and eyes (jaundice). This is seen with hepatitis. DIAGNOSIS  To make a diagnosis, your caregiver may:  Take a medical history.  Perform a physical exam.  Take a specimen (culture) to be examined.  Examine a sample of discharge under a microscope.  Perform blood test TREATMENT   Chlamydia, gonorrhea, trichomonas, and syphilis can be cured with antibiotic medicine.  Genital herpes, hepatitis, and HIV can be treated, but not cured, with prescribed medicines. The medicines will lessen the symptoms.  Genital warts from HPV can be treated with medicine or by freezing, burning (electrocautery), or surgery. Warts may come back.  HPV is a virus and cannot be cured with medicine or surgery.However, abnormal areas may be followed very closely by your caregiver and may be removed from the cervix, vagina, or vulva through office procedures or surgery. If your diagnosis is confirmed, your recent sexual partners need treatment. This is true even if they are symptom-free or have a negative culture or evaluation. They should not have sex until their caregiver says it is okay. HOME CARE INSTRUCTIONS  All sexual partners should be informed, tested, and treated for all STDs.  Take your antibiotics as directed. Finish them even if you start to feel better.  Only take over-the-counter or prescription medicines for pain, discomfort, or fever as directed by your caregiver.  Rest.  Eat a balanced diet and drink enough fluids to keep your urine clear or pale yellow.  Do not have sex until treatment is completed and you have followed up with your caregiver. STDs should be checked after treatment.  Keep all follow-up appointments, Pap tests, and blood tests as directed by your caregiver.  Only use latex condoms and water-soluble lubricants during sexual activity. Do not use  petroleum jelly or oils.  Avoid alcohol and illegal drugs.  Get vaccinated for HPV and hepatitis. If you have not received these vaccines in the past, talk to your caregiver about whether one or both might be right for you.  Avoid risky sex practices that can break the skin. The only way to avoid getting an STD is to avoid all sexual activity.Latex condoms and dental dams (for oral sex) will help lessen the risk of getting an STD, but will not completely eliminate the risk. SEEK MEDICAL CARE IF:   You have a fever.  You have any new or worsening symptoms. Document Released: 03/21/2002 Document Revised: 03/23/2011 Document Reviewed: 03/28/2010 [facility] Patient Information 2013 [place].    Domestic Abuse You are being battered or abused if someone close to you hits, pushes, or physically hurts you in any way. You also are being abused if you are forced into activities. You are being sexually abused if you are forced to have sexual contact of any kind. You are being emotionally abused if you are made to feel worthless or if you are constantly threatened. It is important to remember that help is available. No one has the right to abuse you. PREVENTION OF FURTHER  ABUSE  Learn the warning signs of danger. This varies with situations but may include: the use of alcohol, threats, isolation from friends and family, or forced sexual contact. Leave if you feel that violence is going to occur.  If you are attacked or beaten, report it to the police so the abuse is documented. You do not have to press charges. The police can protect you while you or the attackers are leaving. Get the officer's name and badge number and a copy of the report.  Find someone you can trust and tell them what is happening to you: your caregiver, a nurse, clergy member, close friend or family member. Feeling ashamed is natural, but remember that you have done nothing wrong. No one deserves abuse. Document Released:  12/27/1999 Document Revised: 03/23/2011 Document Reviewed: 03/06/2010 ExitCare Patient Information 2013 [employer]Care[employer].    How Much is Too Much Alcohol? Drinking too much alcohol can cause injury, accidents, and health problems. These types of problems can include:   Car crashes.  Falls.  Family fighting (domestic violence).  Drowning.  Fights.  Injuries.  Burns.  Damage to certain organs.  Having a baby with birth defects. ONE DRINK CAN BE TOO MUCH WHEN YOU ARE:  Working.  Pregnant or breastfeeding.  Taking medicines. Ask your doctor.  Driving or planning to drive. If you or someone you know has a drinking problem, get help from a doctor.  Document Released: 10/25/2008 Document Revised: 03/23/2011 Document Reviewed: 10/25/2008 ExitCare Patient Information 2013 [employer]Care[employer].   Smoking Hazards Smoking cigarettes is extremely bad for your health. Tobacco smoke has over 200 known poisons in it. There are over 60 chemicals in tobacco smoke that cause cancer. Some of the chemicals found in cigarette smoke include:   Cyanide.  Benzene.  Formaldehyde.  Methanol (wood alcohol).  Acetylene (fuel used in welding torches).  Ammonia. Cigarette smoke also contains the poisonous gases nitrogen oxide and carbon monoxide.  Cigarette smokers have an increased risk of many serious medical problems and Smoking causes approximately:  90% of all lung cancer deaths in men.  80% of all lung cancer deaths in women.  90% of deaths from chronic obstructive lung disease. Compared with nonsmokers, smoking increases the risk of:  Coronary heart disease by 2 to 4 times.  Stroke by 2 to 4 times.  Men developing lung cancer by 23 times.  Women developing lung cancer by 13 times.  Dying from chronic obstructive lung diseases by 12 times.  . Smoking is the most preventable cause of death and disease in our society.  WHY IS SMOKING ADDICTIVE?  Nicotine is the chemical  agent in tobacco that is capable of causing addiction or dependence.  When you smoke and inhale, nicotine is absorbed rapidly into the bloodstream through your lungs. Nicotine absorbed through the lungs is capable of creating a powerful addiction. Both inhaled and non-inhaled nicotine may be addictive.  Addiction studies of cigarettes and spit tobacco show that addiction to nicotine occurs mainly during the teen years, when young people begin using tobacco products. WHAT ARE THE BENEFITS OF QUITTING?  There are many health benefits to quitting smoking.   Likelihood of developing cancer and heart disease decreases. Health improvements are seen almost immediately.  Blood pressure, pulse rate, and breathing patterns start returning to normal soon after quitting. QUITTING SMOKING   American Lung Association - 1-800-LUNGUSA  American Cancer Society - 1-800-ACS-2345 Document Released: 02/06/2004 Document Revised: 03/23/2011 Document Reviewed: 10/10/2008 ExitCare Patient Information 2013 ExitCare,   LLC.   Stress Management Stress is a state of physical or mental tension that often results from changes in your life or normal routine. Some common causes of stress are:  Death of a loved one.  Injuries or severe illnesses.  Getting fired or changing jobs.  Moving into a new home. Other causes may be:  Sexual problems.  Business or financial losses.  Taking on a large debt.  Regular conflict with someone at home or at work.  Constant tiredness from lack of sleep. It is not just bad things that are stressful. It may be stressful to:  Win the lottery.  Get married.  Buy a new car. The amount of stress that can be easily tolerated varies from person to person. Changes generally cause stress, regardless of the types of change. Too much stress can affect your health. It may lead to physical or emotional problems. Too little stress (boredom) may also become stressful. SUGGESTIONS TO  REDUCE STRESS:  Talk things over with your family and friends. It often is helpful to share your concerns and worries. If you feel your problem is serious, you may want to get help from a professional counselor.  Consider your problems one at a time instead of lumping them all together. Trying to take care of everything at once may seem impossible. List all the things you need to do and then start with the most important one. Set a goal to accomplish 2 or 3 things each day. If you expect to do too many in a single day you will naturally fail, causing you to feel even more stressed.  Do not use alcohol or drugs to relieve stress. Although you may feel better for a short time, they do not remove the problems that caused the stress. They can also be habit forming.  Exercise regularly - at least 3 times per week. Physical exercise can help to relieve that "uptight" feeling and will relax you.  The shortest distance between despair and hope is often a good night's sleep.  Go to bed and get up on time allowing yourself time for appointments without being rushed.  Take a short "time-out" period from any stressful situation that occurs during the day. Close your eyes and take some deep breaths. Starting with the muscles in your face, tense them, hold it for a few seconds, then relax. Repeat this with the muscles in your neck, shoulders, hand, stomach, back and legs.  Take good care of yourself. Eat a balanced diet and get plenty of rest.  Schedule time for having fun. Take a break from your daily routine to relax. HOME CARE INSTRUCTIONS   Call if you feel overwhelmed by your problems and feel you can no longer manage them on your own.  Return immediately if you feel like hurting yourself or someone else. Document Released: 06/24/2000 Document Revised: 03/23/2011 Document Reviewed: 02/14/2007 [facility] Patient Information 2013 [place].  Levonorgestrel intrauterine device (IUD) What is this  medicine? LEVONORGESTREL IUD (LEE voe nor jes trel) is a contraceptive (birth control) device. The device is placed inside the uterus by a healthcare professional. It is used to prevent pregnancy and can also be used to treat heavy bleeding that occurs during your period. Depending on the device, it can be used for 3 to 5 years. This medicine may be used for other purposes; ask your health care provider or pharmacist if you have questions. What should I tell my health care provider before I take this medicine?  They need to know if you have any of these conditions: -abnormal Pap smear -cancer of the breast, uterus, or cervix -diabetes -endometritis -genital or pelvic infection now or in the past -have more than one sexual partner or your partner has more than one partner -heart disease -history of an ectopic or tubal pregnancy -immune system problems -IUD in place -liver disease or tumor -problems with blood clots or take blood-thinners -use intravenous drugs -uterus of unusual shape -vaginal bleeding that has not been explained -an unusual or allergic reaction to levonorgestrel, other hormones, silicone, or polyethylene, medicines, foods, dyes, or preservatives -pregnant or trying to get pregnant -breast-feeding How should I use this medicine? This device is placed inside the uterus by a health care professional. Talk to your pediatrician regarding the use of this medicine in children. Special care may be needed. Overdosage: If you think you have taken too much of this medicine contact a poison control center or emergency room at once. NOTE: This medicine is only for you. Do not share this medicine with others. What if I miss a dose? This does not apply. What may interact with this medicine? Do not take this medicine with any of the following medications: -amprenavir -bosentan -fosamprenavir This medicine may also interact with the following medications: -aprepitant -barbiturate  medicines for inducing sleep or treating seizures -bexarotene -griseofulvin -medicines to treat seizures like carbamazepine, ethotoin, felbamate, oxcarbazepine, phenytoin, topiramate -modafinil -pioglitazone -rifabutin -rifampin -rifapentine -some medicines to treat HIV infection like atazanavir, indinavir, lopinavir, nelfinavir, tipranavir, ritonavir -St. John's wort -warfarin This list may not describe all possible interactions. Give your health care provider a list of all the medicines, herbs, non-prescription drugs, or dietary supplements you use. Also tell them if you smoke, drink alcohol, or use illegal drugs. Some items may interact with your medicine. What should I watch for while using this medicine? Visit your doctor or health care professional for regular check ups. See your doctor if you or your partner has sexual contact with others, becomes HIV positive, or gets a sexual transmitted disease. This product does not protect you against HIV infection (AIDS) or other sexually transmitted diseases. You can check the placement of the IUD yourself by reaching up to the top of your vagina with clean fingers to feel the threads. Do not pull on the threads. It is a good habit to check placement after each menstrual period. Call your doctor right away if you feel more of the IUD than just the threads or if you cannot feel the threads at all. The IUD may come out by itself. You may become pregnant if the device comes out. If you notice that the IUD has come out use a backup birth control method like condoms and call your health care provider. Using tampons will not change the position of the IUD and are okay to use during your period. What side effects may I notice from receiving this medicine? Side effects that you should report to your doctor or health care professional as soon as possible: -allergic reactions like skin rash, itching or hives, swelling of the face, lips, or tongue -fever,  flu-like symptoms -genital sores -high blood pressure -no menstrual period for 6 weeks during use -pain, swelling, warmth in the leg -pelvic pain or tenderness -severe or sudden headache -signs of pregnancy -stomach cramping -sudden shortness of breath -trouble with balance, talking, or walking -unusual vaginal bleeding, discharge -yellowing of the eyes or skin Side effects that usually do not require medical attention (  report to your doctor or health care professional if they continue or are bothersome): -acne -breast pain -change in sex drive or performance -changes in weight -cramping, dizziness, or faintness while the device is being inserted -headache -irregular menstrual bleeding within first 3 to 6 months of use -nausea This list may not describe all possible side effects. Call your doctor for medical advice about side effects. You may report side effects to FDA at 1-800-FDA-1088. Where should I keep my medicine? This does not apply. NOTE: This sheet is a summary. It may not cover all possible information. If you have questions about this medicine, talk to your doctor, pharmacist, or health care provider.    2016, Elsevier/Gold Standard. (2011-01-29 13:54:04)

## 2015-06-26 NOTE — Progress Notes (Signed)
25 y.o. G0P0 Single  Caucasian Fe here for annual exam. Periods normal, no issues.Contraception working well. Periods shorter on OCP. No partner change,no STD testing needed.  Would like to discuss IUD contraception. No other health issues today. Plans to work on weight control. Recent trip to beach!  Patient's last menstrual period was 06/13/2015.          Sexually active: Yes.    The current method of family planning is OCP (estrogen/progesterone).    Exercising: Yes.    walking & jogging occ Smoker:  no  Health Maintenance: Pap: 12/14 neg MMG:  none Colonoscopy:  none BMD:   none TDaP:   Shingles: no Pneumonia: no Hep C and HIV: not done Labs: none Self breast exam: done occ   reports that she has never smoked. She does not have any smokeless tobacco history on file. She reports that she drinks about 1.2 - 1.8 oz of alcohol per week. She reports that she does not use illicit drugs.  Past Medical History  Diagnosis Date  . Depression     Past Surgical History  Procedure Laterality Date  . Wisdom tooth extraction      Current Outpatient Prescriptions  Medication Sig Dispense Refill  . levonorgestrel-ethinyl estradiol (AUBRA) 0.1-20 MG-MCG tablet Take 1 tablet by mouth daily. 3 Package 0   No current facility-administered medications for this visit.    Family History  Problem Relation Age of Onset  . Breast cancer Maternal Grandmother   . Breast cancer Paternal Grandmother   . Colon cancer Paternal Grandmother     ROS:  Pertinent items are noted in HPI.  Otherwise, a comprehensive ROS was negative.  Exam:   BP 104/70 mmHg  Pulse 72  Resp 16  Ht 5' 6.25" (1.683 m)  Wt 175 lb (79.379 kg)  BMI 28.02 kg/m2  LMP 06/13/2015 Height: 5' 6.25" (168.3 cm) Ht Readings from Last 3 Encounters:  06/26/15 5' 6.25" (1.683 m)  03/14/14  (1.676 m)  12/13/13  (1.676 m)    General appearance: alert, cooperative and appears stated age Head: Normocephalic, without  obvious abnormality, atraumatic Neck: no adenopathy, supple, symmetrical, trachea midline and thyroid normal to inspection and palpation Lungs: clear to auscultation bilaterally Breasts: normal appearance, no masses or tenderness, No nipple retraction or dimpling, No nipple discharge or bleeding, No axillary or supraclavicular adenopathy Heart: regular rate and rhythm Abdomen: soft, non-tender; no masses,  no organomegaly Extremities: extremities normal, atraumatic, no cyanosis or edema Skin: Skin color, texture, turgor normal. No rashes or lesions Lymph nodes: Cervical, supraclavicular, and axillary nodes normal. No abnormal inguinal nodes palpated Neurologic: Grossly normal   Pelvic: External genitalia:  no lesions              Urethra:  normal appearing urethra with no masses, tenderness or lesions              Bartholin's and Skene's: normal                 Vagina: normal appearing vagina with normal color and discharge, no lesions              Cervix: no cervical motion tenderness, no lesions and nulliparous appearance              Pap taken: Yes.   Bimanual Exam:  Uterus:  normal size, contour, position, consistency, mobility, non-tender and anteverted              Adnexa: normal  adnexa and no mass, fullness, tenderness               Rectovaginal: Confirms               Anus:  normal appearance  Chaperone present: yes  A:  Well Woman with normal exam  Contraception OCP desired today  IUD contraception information  P:   Reviewed health and wellness pertinent to exam  Rx [name] see order with instructions  Discussed risks/benefits of IUD, insertion/removal/bleeding profile and long term effect. Questions addressed. Discussed will need to have inserted on period and needs to continue OCP until inserted. Patient given pamphlet and information to call for benefits of her insurance. If she decides she will call office for appointment will need MD insertion.   Pap smear as above with  HPV reflex   counseled on breast self exam, STD prevention, HIV risk factors and prevention, use and side effects of OCP's, adequate intake of calcium and vitamin D, diet and exercise  return annually or prn  An After Visit Summary was printed and given to the patient.

## 2015-06-28 LAB — IPS PAP TEST WITH REFLEX TO HPV

## 2015-12-14 DIAGNOSIS — R05 Cough: Secondary | ICD-10-CM | POA: Diagnosis not present

## 2015-12-14 DIAGNOSIS — J0110 Acute frontal sinusitis, unspecified: Secondary | ICD-10-CM | POA: Diagnosis not present

## 2016-06-22 ENCOUNTER — Encounter: Payer: Self-pay | Admitting: Physician Assistant

## 2016-06-22 ENCOUNTER — Ambulatory Visit (INDEPENDENT_AMBULATORY_CARE_PROVIDER_SITE_OTHER): Payer: BLUE CROSS/BLUE SHIELD | Admitting: Physician Assistant

## 2016-06-22 VITALS — BP 110/75 | HR 77 | Temp 98.6°F | Resp 18 | Ht 66.38 in | Wt 191.6 lb

## 2016-06-22 DIAGNOSIS — H109 Unspecified conjunctivitis: Secondary | ICD-10-CM | POA: Diagnosis not present

## 2016-06-22 DIAGNOSIS — J069 Acute upper respiratory infection, unspecified: Secondary | ICD-10-CM

## 2016-06-22 MED ORDER — ERYTHROMYCIN 5 MG/GM OP OINT: 1.0000 "application " | TOPICAL_OINTMENT | Freq: Three times a day (TID) | OPHTHALMIC | 0 refills | Status: AC

## 2016-06-22 NOTE — Patient Instructions (Addendum)
Please take ibuprofen 600mg  every 6 hours.  Continue to gargle with warm salt water.   You can take mucinex 1200mg  every 12 hours, to thin out some of this mucus build up.   cepacol throat lozenges will also help with the sore throat.       IF you received an x-ray today, you will receive an invoice from [facility]. Please contact [facility] at [phone] with questions or concerns regarding your invoice.   IF you received labwork today, you will receive an invoice from [facility]. Please contact [facility] at [phone] with questions or concerns regarding your invoice.   Our billing staff will not be able to assist you with questions regarding bills from these companies.  You will be contacted with the lab results as soon as they are available. The fastest way to get your results is to activate your My Chart account. Instructions are located on the last page of this paperwork. If you have not heard from us regarding the results in 2 weeks, please contact this office.

## 2016-06-22 NOTE — Progress Notes (Signed)
[facility] [address], [place] [phone]  Date:  06/22/2016   Name:  Barbara Figueroa   DOB:  01/16/1990   MRN:  [id number]  PCP:  Garnetta BuddyEnglish, Rufino Staup D, PA    History of Present Illness:  Barbara Figueroa is a 26 y.o. female patient who presents to PCP with  Chief Complaint  Patient presents with  . Sore Throat    on left side  . Eye Problem    X 2 days- irritations  . Sinus Problem     5 days ago, started to have aching, headache, ears clogged, and nasal congestion.  Now her sinus pressure, and right side of throat is painful.  She has no exudate that she has noticed.  Her left eye was matted, runny eye, and has the sensation of sand in eye.   She was taking nighttime cold medicine.  Gargling with salt water.   She generally has sinus allergies, but stated that it was not apparent during this time.   Coughing and scratchy throat.  Productive moreso in the morning.  Sinus pain is resolving.    There are no active problems to display for this patient.   Past Medical History:  Diagnosis Date  . Depression     Past Surgical History:  Procedure Laterality Date  . WISDOM TOOTH EXTRACTION      Social History  Substance Use Topics  . Smoking status: Never Smoker  . Smokeless tobacco: Never Used  . Alcohol use 5.4 - 6.0 oz/week    2 - 3 Standard drinks or equivalent, 3 Glasses of wine, 4 Cans of beer per week    Family History  Problem Relation Age of Onset  . Breast cancer Maternal Grandmother   . Breast cancer Paternal Grandmother   . Colon cancer Paternal Grandmother     No Known Allergies  Medication list has been reviewed and updated.  Current Outpatient Prescriptions on File Prior to Visit  Medication Sig Dispense Refill  . levonorgestrel-ethinyl estradiol (AUBRA) 0.1-20 MG-MCG tablet Take 1 tablet by mouth daily. 3 Package 4   No current facility-administered medications on file prior to visit.     ROS ROS otherwise  unremarkable unless listed above.  Physical Examination: BP 110/75 (BP Location: Right Arm, Patient Position: Sitting, Cuff Size: Large)   Pulse 77   Temp 98.6 F (37 C) (Oral)   Resp 18   Ht 5' 6.38" (1.686 m)   Wt 191 lb 9.6 oz (86.9 kg)   LMP 06/20/2016 (Exact Date)   SpO2 97%   BMI 30.57 kg/m  Ideal Body Weight: Weight in (lb) to have BMI = 25: 156.3  Physical Exam  Constitutional: She is oriented to person, place, and time. She appears well-developed and well-nourished. No distress.  HENT:  Head: Normocephalic and atraumatic.  Right Ear: Tympanic membrane, external ear and ear canal normal.  Left Ear: Tympanic membrane, external ear and ear canal normal.  Nose: Mucosal edema and rhinorrhea present. Right sinus exhibits no maxillary sinus tenderness and no frontal sinus tenderness. Left sinus exhibits no maxillary sinus tenderness and no frontal sinus tenderness.  Mouth/Throat: No uvula swelling. No oropharyngeal exudate, posterior oropharyngeal edema or posterior oropharyngeal erythema.  Eyes: EOM are normal. Pupils are equal, round, and reactive to light. Left conjunctiva is injected.  Cardiovascular: Normal rate and regular rhythm.  Exam reveals no gallop, no distant heart sounds and no friction rub.   No murmur heard. Pulmonary/Chest: Effort  normal. No respiratory distress. She has no decreased breath sounds. She has no wheezes. She has no rhonchi.  Lymphadenopathy:       Head (right side): No submandibular, no tonsillar, no preauricular and no posterior auricular adenopathy present.       Head (left side): No submandibular, no tonsillar, no preauricular and no posterior auricular adenopathy present.  Neurological: She is alert and oriented to person, place, and time.  Skin: She is not diaphoretic.  Psychiatric: She has a normal mood and affect. Her behavior is normal.     Assessment and Plan: Barbara Figueroa is a 26 y.o. female who is here today for cc of  Chief  Complaint  Patient presents with  . Sore Throat    on left side  . Eye Problem    X 2 days- irritations  . Sinus Problem  appears viral, but will treat for possible bacterial etiology of conjunctivitis Bacterial conjunctivitis - Plan: erythromycin (ROMYCIN) ophthalmic ointment  Acute upper respiratory infection  Trena Platt, PA-C [facility] 6/14/20187:53 AM

## 2016-06-26 ENCOUNTER — Ambulatory Visit: Payer: BLUE CROSS/BLUE SHIELD | Admitting: Certified Nurse Midwife

## 2016-07-08 ENCOUNTER — Ambulatory Visit (INDEPENDENT_AMBULATORY_CARE_PROVIDER_SITE_OTHER): Payer: BLUE CROSS/BLUE SHIELD | Admitting: Obstetrics and Gynecology

## 2016-07-08 ENCOUNTER — Encounter: Payer: Self-pay | Admitting: Obstetrics and Gynecology

## 2016-07-08 VITALS — BP 102/60 | HR 76 | Resp 14 | Ht 66.5 in | Wt 194.0 lb

## 2016-07-08 DIAGNOSIS — Z01419 Encounter for gynecological examination (general) (routine) without abnormal findings: Secondary | ICD-10-CM | POA: Diagnosis not present

## 2016-07-08 DIAGNOSIS — Z113 Encounter for screening for infections with a predominantly sexual mode of transmission: Secondary | ICD-10-CM

## 2016-07-08 DIAGNOSIS — Z3040 Encounter for surveillance of contraceptives, unspecified: Secondary | ICD-10-CM | POA: Diagnosis not present

## 2016-07-08 DIAGNOSIS — Z0000 Encounter for general adult medical examination without abnormal findings: Secondary | ICD-10-CM | POA: Diagnosis not present

## 2016-07-08 MED ORDER — LEVONORGESTREL-ETHINYL ESTRAD 0.1-20 MG-MCG PO TABS: 1.0000 | ORAL_TABLET | Freq: Every day | ORAL | 3 refills | Status: DC

## 2016-07-08 NOTE — Patient Instructions (Signed)

## 2016-07-08 NOTE — Progress Notes (Signed)
26 y.o. G0P0 SingleCaucasianF here for annual exam.   She is on OCP's, taking her pills consistently. She is bleeding prior to the last row, usually bleeds 1/2 was through the last row. Tolerable. Cramps are tolerable. She has been noticing more PMS recently, bloating, breast tenderness, mood changes. PMS symptoms last for about 3 days. The only other contraception she has tried is the patch.  Sexually active, same partner since February, using condoms.  Period Duration (Days): 3 days  Period Pattern: (!) Irregular Menstrual Flow: Heavy Menstrual Control: Tampon Menstrual Control Change Freq (Hours): changes tampon evry 6 hours  Dysmenorrhea: (!) Moderate Dysmenorrhea Symptoms: Cramping, Headache  Patient's last menstrual period was 06/24/2016.          Sexually active: Yes.    The current method of family planning is OCP (estrogen/progesterone).    Exercising: No.  The patient does not participate in regular exercise at present. Smoker:  no  Health Maintenance: Pap:  06/26/15 Neg  History of abnormal Pap:  no MMG:  12/15/13 BIRADS2:benign. F/u age 26  TDaP:  06-26-15 Gardasil: completed all 3    reports that she has never smoked. She has never used smokeless tobacco. She reports that she drinks about 2.4 - 3.0 oz of alcohol per week . She reports that she does not use drugs.She manages a [employer].   Past Medical History:  Diagnosis Date  . Depression     Past Surgical History:  Procedure Laterality Date  . WISDOM TOOTH EXTRACTION      Current Outpatient Prescriptions  Medication Sig Dispense Refill  . levonorgestrel-ethinyl estradiol (AUBRA) 0.1-20 MG-MCG tablet Take 1 tablet by mouth daily. 3 Package 4   No current facility-administered medications for this visit.     Family History  Problem Relation Age of Onset  . Breast cancer Maternal Grandmother   . Breast cancer Paternal Grandmother   . Colon cancer Paternal Grandmother     Review of Systems   Constitutional: Negative.   HENT: Negative.   Eyes: Negative.   Respiratory: Negative.   Cardiovascular: Negative.   Gastrointestinal: Negative.   Endocrine: Negative.   Genitourinary: Positive for menstrual problem.       Irregular menstrual cycles   Musculoskeletal: Negative.   Skin: Negative.   Allergic/Immunologic: Negative.   Neurological: Negative.   Psychiatric/Behavioral:       PMS- Moody     Exam:   BP 102/60 (BP Location: Right Arm, Patient Position: Sitting, Cuff Size: Normal)   Pulse 76   Resp 14   Ht 5' 6.5" (1.689 m)   Wt 194 lb (88 kg)   LMP 06/24/2016   BMI 30.84 kg/m   Weight change: @WEIGHTCHANGE @ Height:   Height: 5' 6.5" (168.9 cm)  Ht Readings from Last 3 Encounters:  07/08/16 5' 6.5" (1.689 m)  06/22/16 5' 6.38" (1.686 m)  06/26/15 5' 6.25" (1.683 m)    General appearance: alert, cooperative and appears stated age Head: Normocephalic, without obvious abnormality, atraumatic Neck: no adenopathy, supple, symmetrical, trachea midline and thyroid normal to inspection and palpation Lungs: clear to auscultation bilaterally Cardiovascular: regular rate and rhythm Breasts: normal appearance, no masses or tenderness Abdomen: soft, non-tender; bowel sounds normal; no masses,  no organomegaly Extremities: extremities normal, atraumatic, no cyanosis or edema Skin: Skin color, texture, turgor normal. No rashes or lesions Lymph nodes: Cervical, supraclavicular, and axillary nodes normal. No abnormal inguinal nodes palpated Neurologic: Grossly normal   Pelvic: External genitalia:  no lesions  Urethra:  normal appearing urethra with no masses, tenderness or lesions              Bartholins and Skenes: normal                 Vagina: normal appearing vagina with normal color and discharge, no lesions              Cervix: no lesions               Bimanual Exam:  Uterus:  normal size, contour, position, consistency, mobility, non-tender and  anteverted              Adnexa: no mass, fullness, tenderness               Rectovaginal: Confirms               Anus:  normal sphincter tone, no lesions  Chaperone was present for exam.  A:  Well Woman with normal exam  Mild PMS  Bleeding slightly early in her OCP pack  Screening std  P:   No pap this year  She declines a change in OCP's, will continue with the current pills  Continue to use condoms  Discussed breast self exam  Discussed calcium and vit D intake  Screening STD  Screening Labs

## 2016-07-09 LAB — LIPID PANEL
CHOL/HDL RATIO: 4.2 ratio (ref 0.0–4.4)
CHOLESTEROL TOTAL: 230 mg/dL — AB (ref 100–199)
HDL: 55 mg/dL (ref >39)
LDL CALC: 153 mg/dL — AB (ref 0–99)
TRIGLYCERIDES: 108 mg/dL (ref 0–149)
VLDL Cholesterol Cal: 22 mg/dL (ref 5–40)

## 2016-07-09 LAB — GC/CHLAMYDIA PROBE AMP
CHLAMYDIA, DNA PROBE: NEGATIVE
NEISSERIA GONORRHOEAE BY PCR: NEGATIVE

## 2016-07-09 LAB — COMPREHENSIVE METABOLIC PANEL
ALK PHOS: 69 IU/L (ref 39–117)
ALT: 10 IU/L (ref 0–32)
AST: 13 IU/L (ref 0–40)
Albumin/Globulin Ratio: 1.6 (ref 1.2–2.2)
Albumin: 4.3 g/dL (ref 3.5–5.5)
BUN/Creatinine Ratio: 17 (ref 9–23)
BUN: 13 mg/dL (ref 6–20)
Bilirubin Total: <0.2 mg/dL (ref 0.0–1.2)
CALCIUM: 9 mg/dL (ref 8.7–10.2)
CO2: 21 mmol/L (ref 20–29)
CREATININE: 0.77 mg/dL (ref 0.57–1.00)
Chloride: 104 mmol/L (ref 96–106)
GFR calc Af Amer: 122 mL/min/{1.73_m2} (ref >59)
GFR, EST NON AFRICAN AMERICAN: 106 mL/min/{1.73_m2} (ref >59)
Globulin, Total: 2.7 g/dL (ref 1.5–4.5)
Glucose: 99 mg/dL (ref 65–99)
POTASSIUM: 4.5 mmol/L (ref 3.5–5.2)
SODIUM: 142 mmol/L (ref 134–144)
Total Protein: 7 g/dL (ref 6.0–8.5)

## 2016-07-09 LAB — HSV(HERPES SIMPLEX VRS) I + II AB-IGG: HSV 1 Glycoprotein G Ab, IgG: 1.37 index — ABNORMAL HIGH (ref 0.00–0.90)

## 2016-07-09 LAB — HEPATITIS C ANTIBODY

## 2016-07-09 LAB — CBC
Hematocrit: 38.2 % (ref 34.0–46.6)
Hemoglobin: 12.9 g/dL (ref 11.1–15.9)
MCH: 31.6 pg (ref 26.6–33.0)
MCHC: 33.8 g/dL (ref 31.5–35.7)
MCV: 94 fL (ref 79–97)
PLATELETS: 387 10*3/uL — AB (ref 150–379)
RBC: 4.08 x10E6/uL (ref 3.77–5.28)
RDW: 12.2 % — AB (ref 12.3–15.4)
WBC: 6 10*3/uL (ref 3.4–10.8)

## 2016-07-09 LAB — HEP, RPR, HIV PANEL
HIV SCREEN 4TH GENERATION: NONREACTIVE
Hepatitis B Surface Ag: NEGATIVE
RPR Ser Ql: NONREACTIVE

## 2016-07-13 ENCOUNTER — Telehealth: Payer: Self-pay | Admitting: *Deleted

## 2016-07-13 NOTE — Telephone Encounter (Signed)
Left message to call regarding lab results -eh 

## 2016-07-13 NOTE — Telephone Encounter (Signed)
-----   Message from Romualdo BolkJill Evelyn Jertson, MD sent at 07/09/2016  6:22 PM EDT ----- Please let the patient know that her total cholesterol and LDL are mildly elevated. The rest of her lipid panel is normal. I would recommend regular exercise and eating a diet low in saturated fats. I would recommend checking a fasting lipid panel next year.  She has evidence of a prior HSV 1 infection (typically cold sores, but can be in the genital area). See if she has ever had cold sores or genital blisters. If she has lots of questions, you can set her up for an office visit to discuss. The rest of her lab work was normal

## 2016-07-14 NOTE — Telephone Encounter (Signed)
Patient is returning a call to Elaine. °

## 2016-07-14 NOTE — Telephone Encounter (Signed)
Spoke with patient- see results note -eh 

## 2016-08-06 ENCOUNTER — Ambulatory Visit (INDEPENDENT_AMBULATORY_CARE_PROVIDER_SITE_OTHER): Payer: BLUE CROSS/BLUE SHIELD | Admitting: Physician Assistant

## 2016-08-06 ENCOUNTER — Encounter: Payer: Self-pay | Admitting: Physician Assistant

## 2016-08-06 VITALS — BP 105/74 | HR 73 | Temp 98.2°F | Resp 18 | Ht 66.83 in | Wt 191.6 lb

## 2016-08-06 DIAGNOSIS — F329 Major depressive disorder, single episode, unspecified: Secondary | ICD-10-CM

## 2016-08-06 DIAGNOSIS — R41840 Attention and concentration deficit: Secondary | ICD-10-CM

## 2016-08-06 DIAGNOSIS — F32A Depression, unspecified: Secondary | ICD-10-CM

## 2016-08-06 MED ORDER — CITALOPRAM HYDROBROMIDE 20 MG PO TABS: 20.0000 mg | ORAL_TABLET | Freq: Every day | ORAL | 1 refills | Status: DC

## 2016-08-06 NOTE — Progress Notes (Signed)
[facility] [address], [place] [address] [phone]  Date:  08/06/2016   Name:  Barbara Figueroa   DOB:  [birth year]   MRN:  [id number]  PCP:  Garnetta BuddyEnglish, Cotey Rakes D, PA    History of Present Illness:  Barbara Figueroa is a 26 y.o. female patient who presents to PCP with  Chief Complaint  Patient presents with  . Anxiety    would like to discuss medication   . Depression    sceening was 19      --progressively over the last 2 months, she has had problems with focusing and completing task. --she also feels like her energy is low.  She is sleeping all the time, as she states, to escape her life. --she feels detached from her family, and guilty that she is staying away from her friends and family. --no si/hi.  Appetite has increased, loss of interest in doing the things she normally does, and difficulty concentrating. noticess difficulty getting out of bed.  She is falling behind at work.  [employer] and feeling behind.  She is working later.   --she feels that she is consistently worried about not getting her task done.  Work is incredibly busy.  She will be in school part-time. --stressor of her family moving away from her. --she is stressed about starting back with her schooling.  She has a hx of using vyvanse during school.  Thinks this was give to her in her 2920s for school.  She stopped this along with the lexapro, when she was getting a headache more so with the vyvanse. --she denies any hx of witnessing or part of traumatic event. --mother and sister with hx of anxiety --no family hx of thyroid disease. etOH intake is few nights per week, but not lately 3 nights per week with 1 bottle each night Tobacco or vaping: none Illicit drug use: none Sexually active: one partner unprotected. On birth control--07/18/2016  There are no active problems to display for this patient.   Past Medical History:  Diagnosis Date  . Depression     Past Surgical History:   Procedure Laterality Date  . WISDOM TOOTH EXTRACTION      Social History  Substance Use Topics  . Smoking status: Never Smoker  . Smokeless tobacco: Never Used  . Alcohol use 2.4 - 3.0 oz/week    2 - 3 Standard drinks or equivalent, 2 Glasses of wine per week    Family History  Problem Relation Age of Onset  . Breast cancer Maternal Grandmother   . Breast cancer Paternal Grandmother   . Colon cancer Paternal Grandmother     No Known Allergies  Medication list has been reviewed and updated.  Current Outpatient Prescriptions on File Prior to Visit  Medication Sig Dispense Refill  . levonorgestrel-ethinyl estradiol (AUBRA) 0.1-20 MG-MCG tablet Take 1 tablet by mouth daily. 3 Package 3   No current facility-administered medications on file prior to visit.     ROS ROS otherwise unremarkable unless listed above.  Physical Examination: BP 105/74   Pulse 73   Temp 98.2 F (36.8 C) (Oral)   Resp 18   Ht 5' 6.83" (1.697 m)   Wt 191 lb 9.6 oz (86.9 kg)   LMP 07/18/2016   SpO2 99%   BMI 30.16 kg/m  Ideal Body Weight: Weight in (lb) to have BMI = 25: 158.5  Physical Exam  Constitutional: She is oriented to person, place, and time. She  appears well-developed and well-nourished. No distress.  HENT:  Head: Normocephalic and atraumatic.  Right Ear: External ear normal.  Left Ear: External ear normal.  Eyes: Pupils are equal, round, and reactive to light. Conjunctivae and EOM are normal.  Neck: No thyromegaly present.  Cardiovascular: Normal rate.   Pulmonary/Chest: Effort normal. No respiratory distress.  Neurological: She is alert and oriented to person, place, and time.  Skin: She is not diaphoretic.  Psychiatric: She has a normal mood and affect. Her behavior is normal.     Assessment and Plan: Barbara Figueroa is a 26 y.o. female who is here today for depression and anxiety We will attempt celexa at low dose.  Difficult to determine if there is attention deficit,  or depressive features are at play.  Advised that we should treat mood, and then address the concentration piece.  This may need to be further diagnosed by an attention specialist.  Referral placed for consult In meantime follow up in 5 weeks for result of anti-depressant. Inattention - Plan: Ambulatory referral to Psychiatry  Depression, unspecified depression type - Plan: Ambulatory referral to Psychiatry  Trena PlattStephanie Diara Chaudhari, PA-C [facility] 7/27/201810:02 AM  Trena PlattStephanie Lilliemae Fruge, PA-C [facility] 7/27/201810:00 AM

## 2016-08-06 NOTE — Patient Instructions (Addendum)
I am placing a referral to attention specialist.  Please await this contact.   Listed below, are specialist I recommend.     [facility] Practition[facility]  [address]  [place], [place] [address]    Shanon RosserBarbara Farran  [phone]   Maris BergerKathy Kirstner  [phone]   Marco CollieSusan Kroll-Smith  [phone]    [facility] ([address]Beth Hulan AmatoKincaid, Ernest Beckey RutterMcCoy, Heather Joycelyn SchmidKitchens, Karla [place]) - [phone]   [facility](Julie DuckWhitt) - [phone]   [facility] - [phone]   [facility] - [phone]   Buena IrishBob Mylan - [phone]   [facility] - [phone] (do not file insurance)      IF you received an x-ray today, you will receive an invoice from [facility]. Please contact [facility] at [phone] with questions or concerns regarding your invoice.   IF you received labwork today, you will receive an invoice from [facility]. Please contact [facility] at [phone] with questions or concerns regarding your invoice.   Our billing staff will not be able to assist you with questions regarding bills from these companies.  You will be contacted with the lab results as soon as they are available. The fastest way to get your results is to activate your My Chart account. Instructions are located on the last page of this paperwork. If you have not heard from us regarding the results in 2 weeks, please contact this office.

## 2016-08-26 DIAGNOSIS — F902 Attention-deficit hyperactivity disorder, combined type: Secondary | ICD-10-CM | POA: Diagnosis not present

## 2016-09-02 DIAGNOSIS — F331 Major depressive disorder, recurrent, moderate: Secondary | ICD-10-CM | POA: Diagnosis not present

## 2016-09-02 DIAGNOSIS — F900 Attention-deficit hyperactivity disorder, predominantly inattentive type: Secondary | ICD-10-CM | POA: Diagnosis not present

## 2016-09-02 DIAGNOSIS — F411 Generalized anxiety disorder: Secondary | ICD-10-CM | POA: Diagnosis not present

## 2016-09-17 DIAGNOSIS — F4310 Post-traumatic stress disorder, unspecified: Secondary | ICD-10-CM | POA: Diagnosis not present

## 2016-09-17 DIAGNOSIS — F900 Attention-deficit hyperactivity disorder, predominantly inattentive type: Secondary | ICD-10-CM | POA: Diagnosis not present

## 2016-09-17 DIAGNOSIS — F331 Major depressive disorder, recurrent, moderate: Secondary | ICD-10-CM | POA: Diagnosis not present

## 2016-09-17 DIAGNOSIS — F411 Generalized anxiety disorder: Secondary | ICD-10-CM | POA: Diagnosis not present

## 2016-09-30 DIAGNOSIS — F900 Attention-deficit hyperactivity disorder, predominantly inattentive type: Secondary | ICD-10-CM | POA: Diagnosis not present

## 2016-09-30 DIAGNOSIS — F331 Major depressive disorder, recurrent, moderate: Secondary | ICD-10-CM | POA: Diagnosis not present

## 2016-09-30 DIAGNOSIS — F411 Generalized anxiety disorder: Secondary | ICD-10-CM | POA: Diagnosis not present

## 2016-11-26 DIAGNOSIS — F411 Generalized anxiety disorder: Secondary | ICD-10-CM | POA: Diagnosis not present

## 2016-11-26 DIAGNOSIS — F331 Major depressive disorder, recurrent, moderate: Secondary | ICD-10-CM | POA: Diagnosis not present

## 2016-11-26 DIAGNOSIS — F900 Attention-deficit hyperactivity disorder, predominantly inattentive type: Secondary | ICD-10-CM | POA: Diagnosis not present

## 2017-03-15 DIAGNOSIS — F900 Attention-deficit hyperactivity disorder, predominantly inattentive type: Secondary | ICD-10-CM | POA: Diagnosis not present

## 2017-03-15 DIAGNOSIS — F411 Generalized anxiety disorder: Secondary | ICD-10-CM | POA: Diagnosis not present

## 2017-03-15 DIAGNOSIS — F331 Major depressive disorder, recurrent, moderate: Secondary | ICD-10-CM | POA: Diagnosis not present

## 2017-04-14 ENCOUNTER — Encounter: Payer: Self-pay | Admitting: Physician Assistant

## 2017-05-02 ENCOUNTER — Other Ambulatory Visit: Payer: Self-pay | Admitting: Obstetrics and Gynecology

## 2017-05-27 ENCOUNTER — Other Ambulatory Visit: Payer: Self-pay | Admitting: Obstetrics and Gynecology

## 2017-05-27 MED ORDER — LEVONORGESTREL-ETHINYL ESTRAD 0.1-20 MG-MCG PO TABS: 1.0000 | ORAL_TABLET | Freq: Every day | ORAL | 0 refills | Status: DC

## 2017-05-27 NOTE — Telephone Encounter (Signed)
Patient called requesting refill for birth control. 

## 2017-05-27 NOTE — Telephone Encounter (Signed)
Medication refill request: Barbara Figueroa  Last AEX:  07/08/16 JJ  Next AEX: 07/14/17  Last MMG (if hormonal medication request): n/a Refill authorized: 07/08/16 3 packs, 3RF. Today, please advise.

## 2017-05-28 ENCOUNTER — Telehealth: Payer: Self-pay | Admitting: Obstetrics and Gynecology

## 2017-05-28 NOTE — Telephone Encounter (Signed)
Call placed to [facility] pharmacy. Was advised Rx for OCP Aubra received on 05/27/17, placed on hold, too soon to refill. Last filled March 2019.   Call returned to patient, advised as seen above per pharmacy. Patient states she is out of medication, asking if she can pay out of pocket. Advised patient f/u with pharmacy for options. Return call to office with any additional questions.  Routing to provider for final review. Patient is agreeable to disposition. Will close encounter.

## 2017-05-28 NOTE — Telephone Encounter (Signed)
Patient called her pharmacy and was told that her prescription was not called to her pharmacy yesterday. I confirmed the pharmacy on file. Patient is calling for the status.

## 2017-05-31 NOTE — Telephone Encounter (Signed)
Patient spoke with pharmacy on 5/17, is paying out of pocket for RX and requesting reimbursement from her insurance. Patient thankful for return call.   Routing to provider for final review. Patient is agreeable to disposition. Will close encounter.

## 2017-05-31 NOTE — Telephone Encounter (Signed)
Please confirm with the patient that she was able to get her pills. If not we need to help her. I don't want her to go without her contraception.

## 2017-06-14 DIAGNOSIS — F331 Major depressive disorder, recurrent, moderate: Secondary | ICD-10-CM | POA: Diagnosis not present

## 2017-06-14 DIAGNOSIS — F900 Attention-deficit hyperactivity disorder, predominantly inattentive type: Secondary | ICD-10-CM | POA: Diagnosis not present

## 2017-06-14 DIAGNOSIS — F411 Generalized anxiety disorder: Secondary | ICD-10-CM | POA: Diagnosis not present

## 2017-07-14 ENCOUNTER — Ambulatory Visit: Payer: BLUE CROSS/BLUE SHIELD | Admitting: Obstetrics and Gynecology

## 2017-07-14 ENCOUNTER — Encounter: Payer: Self-pay | Admitting: Obstetrics and Gynecology

## 2017-07-14 ENCOUNTER — Other Ambulatory Visit: Payer: Self-pay

## 2017-07-14 VITALS — BP 105/76 | HR 62 | Ht 66.0 in | Wt 161.6 lb

## 2017-07-14 DIAGNOSIS — Z0000 Encounter for general adult medical examination without abnormal findings: Secondary | ICD-10-CM | POA: Diagnosis not present

## 2017-07-14 DIAGNOSIS — Z01419 Encounter for gynecological examination (general) (routine) without abnormal findings: Secondary | ICD-10-CM | POA: Diagnosis not present

## 2017-07-14 DIAGNOSIS — Z23 Encounter for immunization: Secondary | ICD-10-CM | POA: Diagnosis not present

## 2017-07-14 MED ORDER — LEVONORGESTREL-ETHINYL ESTRAD 0.1-20 MG-MCG PO TABS: 1.0000 | ORAL_TABLET | Freq: Every day | ORAL | 3 refills | Status: DC

## 2017-07-14 NOTE — Patient Instructions (Addendum)
EXERCISE AND DIET:  We recommended that you start or continue a regular exercise program for good health. Regular exercise means any activity that makes your heart beat faster and makes you sweat.  We recommend exercising at least 30 minutes per day at least 3 days a week, preferably 4 or 5.  We also recommend a diet low in fat and sugar.  Inactivity, poor dietary choices and obesity can cause diabetes, heart attack, stroke, and kidney damage, among others.    ALCOHOL AND SMOKING:  Women should limit their alcohol intake to no more than 7 drinks/beers/glasses of wine (combined, not each!) per week. Moderation of alcohol intake to this level decreases your risk of breast cancer and liver damage. And of course, no recreational drugs are part of a healthy lifestyle.  And absolutely no smoking or even second hand smoke. Most people know smoking can cause heart and lung diseases, but did you know it also contributes to weakening of your bones? Aging of your skin?  Yellowing of your teeth and nails?  CALCIUM AND VITAMIN D:  Adequate intake of calcium and Vitamin D are recommended.  The recommendations for exact amounts of these supplements seem to change often, but generally speaking 600 mg of calcium (either carbonate or citrate) and 800 units of Vitamin D per day seems prudent. Certain women may benefit from higher intake of Vitamin D.  If you are among these women, your doctor will have told you during your visit.    PAP SMEARS:  Pap smears, to check for cervical cancer or precancers,  have traditionally been done yearly, although recent scientific advances have shown that most women can have pap smears less often.  However, every woman still should have a physical exam from her gynecologist every year. It will include a breast check, inspection of the vulva and vagina to check for abnormal growths or skin changes, a visual exam of the cervix, and then an exam to evaluate the size and shape of the uterus and  ovaries.  And after 28 years of age, a rectal exam is indicated to check for rectal cancers. We will also provide age appropriate advice regarding health maintenance, like when you should have certain vaccines, screening for sexually transmitted diseases, bone density testing, colonoscopy, mammograms, etc.   MAMMOGRAMS:  All women over 40 years old should have a yearly mammogram. Many facilities now offer a "3D" mammogram, which may cost around $50 extra out of pocket. If possible,  we recommend you accept the option to have the 3D mammogram performed.  It both reduces the number of women who will be called back for extra views which then turn out to be normal, and it is better than the routine mammogram at detecting truly abnormal areas.    COLONOSCOPY:  Colonoscopy to screen for colon cancer is recommended for all women at age 50.  We know, you hate the idea of the prep.  We agree, BUT, having colon cancer and not knowing it is worse!!  Colon cancer so often starts as a polyp that can be seen and removed at colonscopy, which can quite literally save your life!  And if your first colonoscopy is normal and you have no family history of colon cancer, most women don't have to have it again for 10 years.  Once every ten years, you can do something that may end up saving your life, right?  We will be happy to help you get it scheduled when you are ready.    Be sure to check your insurance coverage so you understand how much it will cost.  It may be covered as a preventative service at no cost, but you should check your particular policy.      Breast Self-Awareness Breast self-awareness means being familiar with how your breasts look and feel. It involves checking your breasts regularly and reporting any changes to your health care provider. Practicing breast self-awareness is important. A change in your breasts can be a sign of a serious medical problem. Being familiar with how your breasts look and feel allows  you to find any problems early, when treatment is more likely to be successful. All women should practice breast self-awareness, including women who have had breast implants. How to do a breast self-exam One way to learn what is normal for your breasts and whether your breasts are changing is to do a breast self-exam. To do a breast self-exam: Look for Changes  1. Remove all the clothing above your waist. 2. Stand in front of a mirror in a room with good lighting. 3. Put your hands on your hips. 4. Push your hands firmly downward. 5. Compare your breasts in the mirror. Look for differences between them (asymmetry), such as: ? Differences in shape. ? Differences in size. ? Puckers, dips, and bumps in one breast and not the other. 6. Look at each breast for changes in your skin, such as: ? Redness. ? Scaly areas. 7. Look for changes in your nipples, such as: ? Discharge. ? Bleeding. ? Dimpling. ? Redness. ? A change in position. Feel for Changes  Carefully feel your breasts for lumps and changes. It is best to do this while lying on your back on the floor and again while sitting or standing in the shower or tub with soapy water on your skin. Feel each breast in the following way:  Place the arm on the side of the breast you are examining above your head.  Feel your breast with the other hand.  Start in the nipple area and make  inch (2 cm) overlapping circles to feel your breast. Use the pads of your three middle fingers to do this. Apply light pressure, then medium pressure, then firm pressure. The light pressure will allow you to feel the tissue closest to the skin. The medium pressure will allow you to feel the tissue that is a little deeper. The firm pressure will allow you to feel the tissue close to the ribs.  Continue the overlapping circles, moving downward over the breast until you feel your ribs below your breast.  Move one finger-width toward the center of the body.  Continue to use the  inch (2 cm) overlapping circles to feel your breast as you move slowly up toward your collarbone.  Continue the up and down exam using all three pressures until you reach your armpit.  Write Down What You Find  Write down what is normal for each breast and any changes that you find. Keep a written record with breast changes or normal findings for each breast. By writing this information down, you do not need to depend only on memory for size, tenderness, or location. Write down where you are in your menstrual cycle, if you are still menstruating. If you are having trouble noticing differences in your breasts, do not get discouraged. With time you will become more familiar with the variations in your breasts and more comfortable with the exam. How often should I examine my breasts? Examine   your breasts every month. If you are breastfeeding, the best time to examine your breasts is after a feeding or after using a breast pump. If you menstruate, the best time to examine your breasts is 5-7 days after your period is over. During your period, your breasts are lumpier, and it may be more difficult to notice changes. When should I see my health care provider? See your health care provider if you notice:  A change in shape or size of your breasts or nipples.  A change in the skin of your breast or nipples, such as a reddened or scaly area.  Unusual discharge from your nipples.  A lump or thick area that was not there before.  Pain in your breasts.  Anything that concerns you.  This information is not intended to replace advice given to you by your health care provider. Make sure you discuss any questions you have with your health care provider. Document Released: 12/29/2004 Document Revised: 06/06/2015 Document Reviewed: 11/18/2014 Elsevier Interactive Patient Education  2018 Elsevier Inc.  

## 2017-07-14 NOTE — Addendum Note (Signed)
Addended by: Ginny ForthSPRAGUE, KAITLYN E on: 07/14/2017 04:59 PM   Modules accepted: Orders

## 2017-07-14 NOTE — Progress Notes (Signed)
Patient declined to have labs at this visit today. Will have these with PCP. Orders canceled.

## 2017-07-14 NOTE — Progress Notes (Signed)
27 y.o. G0P0 SingleCaucasianF here for annual exam.  She is on OCP's. Sexually active, same partner. No dyspareunia.  Period Duration (Days): 2-3 days in length Period Pattern: Regular Menstrual Flow: Heavy, Moderate Menstrual Control: Tampon, Panty liner Menstrual Control Change Freq (Hours): changes tampon every 3 hours first few days, every 8 hours after Dysmenorrhea: None  Patient's last menstrual period was 06/24/2017 (exact date).          Sexually active: Yes.    The current method of family planning is OCP (estrogen/progesterone).    Exercising: No.  The patient does not participate in regular exercise at present. Smoker:  no  Health Maintenance: Pap:  06/24/2015 normal History of abnormal Pap:  no TDaP:  Patient unsure of last TDAP, would like this today Gardasil: completed all 3 series   reports that she has never smoked. She has never used smokeless tobacco. She reports that she drinks about 2.4 - 3.0 oz of alcohol per week. She reports that she does not use drugs. [employer]. She is going to go back to college, will study HR.   Past Medical History:  Diagnosis Date  . Depression     Past Surgical History:  Procedure Laterality Date  . WISDOM TOOTH EXTRACTION      Current Outpatient Medications  Medication Sig Dispense Refill  . citalopram (CELEXA) 20 MG tablet Take 1 tablet (20 mg total) by mouth daily. Take half tablet daily for 1 week, then increase to full tablet thereafter 30 tablet 1  . levonorgestrel-ethinyl estradiol (AUBRA) 0.1-20 MG-MCG tablet Take 1 tablet by mouth daily. 3 Package 0   No current facility-administered medications for this visit.     Family History  Problem Relation Age of Onset  . Breast cancer Maternal Grandmother   . Breast cancer Paternal Grandmother   . Colon cancer Paternal Grandmother     Review of Systems  Constitutional: Negative.   HENT: Negative.   Eyes: Negative.   Respiratory: Negative.    Cardiovascular: Negative.   Gastrointestinal: Negative.   Endocrine: Negative.   Genitourinary: Negative.   Musculoskeletal: Negative.   Skin: Negative.   Allergic/Immunologic: Negative.   Neurological: Negative.   Hematological: Negative.   Psychiatric/Behavioral: Negative.     Exam:   BP 105/76 (BP Location: Right Arm, Patient Position: Sitting)   Pulse 62   Ht 5\' 6"  (1.676 m)   Wt 161 lb 9.6 oz (73.3 kg)   LMP 06/24/2017 (Exact Date)   BMI 26.08 kg/m   Weight change: @WEIGHTCHANGE @ Height:   Height: 5\' 6"  (167.6 cm)  Ht Readings from Last 3 Encounters:  07/14/17 5\' 6"  (1.676 m)  08/06/16 5' 6.83" (1.697 m)  07/08/16 5' 6.5" (1.689 m)    General appearance: alert, cooperative and appears stated age Head: Normocephalic, without obvious abnormality, atraumatic Neck: no adenopathy, supple, symmetrical, trachea midline and thyroid normal to inspection and palpation Lungs: clear to auscultation bilaterally Cardiovascular: regular rate and rhythm Breasts: normal appearance, no masses or tenderness Abdomen: soft, non-tender; non distended,  no masses,  no organomegaly Extremities: extremities normal, atraumatic, no cyanosis or edema Skin: Skin color, texture, turgor normal. No rashes or lesions Lymph nodes: Cervical, supraclavicular, and axillary nodes normal. No abnormal inguinal nodes palpated Neurologic: Grossly normal   Pelvic: External genitalia:  no lesions              Urethra:  normal appearing urethra with no masses, tenderness or lesions  Bartholins and Skenes: normal                 Vagina: normal appearing vagina with normal color and discharge, no lesions              Cervix: no lesions               Bimanual Exam:  Uterus:  normal size, contour, position, consistency, mobility, non-tender and anteverted              Adnexa: no mass, fullness, tenderness               Rectovaginal: Confirms               Anus:  normal sphincter tone, no  lesions  Chaperone was present for exam.  A:  Well Woman with normal exam  P:   No pap this year  Discussed breast self exam  Discussed calcium and vit D intake  TDAP due  Screening labs (fasting)  Continue OCP's

## 2017-07-16 DIAGNOSIS — S92325A Nondisplaced fracture of second metatarsal bone, left foot, initial encounter for closed fracture: Secondary | ICD-10-CM | POA: Diagnosis not present

## 2017-07-16 DIAGNOSIS — M79672 Pain in left foot: Secondary | ICD-10-CM | POA: Diagnosis not present

## 2017-07-29 DIAGNOSIS — S92325D Nondisplaced fracture of second metatarsal bone, left foot, subsequent encounter for fracture with routine healing: Secondary | ICD-10-CM | POA: Diagnosis not present

## 2018-07-25 NOTE — Progress Notes (Deleted)
28 y.o. G0P0 Single White or Caucasian Not Hispanic or Latino female here for annual exam.      No LMP recorded.          Sexually active: {yes no:314532}  The current method of family planning is {contraception:315051}.    Exercising: {yes no:314532}  {types:19826} Smoker:  {YES [id number]  Health Maintenance: Pap:  06/24/2015 normal History of abnormal Pap:  no TDaP:  07/14/2017 Gardasil: completed all 3   reports that she has never smoked. She has never used smokeless tobacco. She reports current alcohol use of about 4.0 - 5.0 standard drinks of alcohol per week. She reports that she does not use drugs.  Past Medical History:  Diagnosis Date  . Depression     Past Surgical History:  Procedure Laterality Date  . WISDOM TOOTH EXTRACTION      Current Outpatient Medications  Medication Sig Dispense Refill  . citalopram (CELEXA) 20 MG tablet Take 1 tablet (20 mg total) by mouth daily. Take half tablet daily for 1 week, then increase to full tablet thereafter 30 tablet 1  . levonorgestrel-ethinyl estradiol (AUBRA) 0.1-20 MG-MCG tablet Take 1 tablet by mouth daily. 3 Package 3   No current facility-administered medications for this visit.     Family History  Problem Relation Age of Onset  . Breast cancer Maternal Grandmother   . Breast cancer Paternal Grandmother   . Colon cancer Paternal Grandmother     Review of Systems  Exam:   There were no vitals taken for this visit.  Weight change: @WEIGHTCHANGE @ Height:      Ht Readings from Last 3 Encounters:  07/14/17 5\' 6"  (1.676 m)  08/06/16 5' 6.83" (1.697 m)  07/08/16 5' 6.5" (1.689 m)    General appearance: alert, cooperative and appears stated age Head: Normocephalic, without obvious abnormality, atraumatic Neck: no adenopathy, supple, symmetrical, trachea midline and thyroid {CHL AMB PHY EX THYROID NORM [phone]::"normal to inspection and palpation"} Lungs: clear to auscultation bilaterally Cardiovascular:  regular rate and rhythm Breasts: {Exam; breast:13139::"normal appearance, no masses or tenderness"} Abdomen: soft, non-tender; non distended,  no masses,  no organomegaly Extremities: extremities normal, atraumatic, no cyanosis or edema Skin: Skin color, texture, turgor normal. No rashes or lesions Lymph nodes: Cervical, supraclavicular, and axillary nodes normal. No abnormal inguinal nodes palpated Neurologic: Grossly normal   Pelvic: External genitalia:  no lesions              Urethra:  normal appearing urethra with no masses, tenderness or lesions              Bartholins and Skenes: normal                 Vagina: normal appearing vagina with normal color and discharge, no lesions              Cervix: {CHL AMB PHY EX CERVIX NORM [phone]::"no lesions"}               Bimanual Exam:  Uterus:  {CHL AMB PHY EX UTERUS NORM DEFAULT:[phone]::"normal size, contour, position, consistency, mobility, non-tender"}              Adnexa: {CHL AMB PHY EX ADNEXA NO MASS [phone]::"no mass, fullness, tenderness"}               Rectovaginal: Confirms               Anus:  normal sphincter tone, no lesions  Chaperone was present for exam.  A:  Well Woman with normal exam  P:

## 2018-07-27 ENCOUNTER — Ambulatory Visit: Payer: BLUE CROSS/BLUE SHIELD | Admitting: Obstetrics and Gynecology

## 2018-08-02 ENCOUNTER — Other Ambulatory Visit: Payer: Self-pay | Admitting: Obstetrics and Gynecology

## 2018-08-02 NOTE — Telephone Encounter (Signed)
Medication refill request: OCP Last AEX:  07-14-17 JJ Next AEX: patient states insurance not accepted, currently looking for new provider Last MMG (if hormonal medication request): n/a Refill authorized: Today, please advise.   Spoke with patient. Advised needs to schedule aex. Patient states insurance not accepted, so currently searching for new provider. RN advised would send request to Dr. Talbert Nan. Pharmacy confirmed as [facility] on [address]. Medication pended for #84, 0RF. Please refill if appropriate.

## 2018-09-15 ENCOUNTER — Encounter (HOSPITAL_COMMUNITY): Payer: Self-pay

## 2018-09-15 ENCOUNTER — Emergency Department (HOSPITAL_COMMUNITY)
Admission: EM | Admit: 2018-09-15 | Discharge: 2018-09-15 | Disposition: A | Discharge status: Home / Self Care | Payer: BLUE CROSS/BLUE SHIELD | Attending: Emergency Medicine | Admitting: Emergency Medicine

## 2018-09-15 ENCOUNTER — Other Ambulatory Visit: Payer: Self-pay

## 2018-09-15 DIAGNOSIS — Z5321 Procedure and treatment not carried out due to patient leaving prior to being seen by health care provider: Secondary | ICD-10-CM | POA: Diagnosis not present

## 2018-09-15 DIAGNOSIS — R102 Pelvic and perineal pain: Secondary | ICD-10-CM | POA: Insufficient documentation

## 2018-09-15 DIAGNOSIS — R339 Retention of urine, unspecified: Secondary | ICD-10-CM | POA: Diagnosis not present

## 2018-09-15 HISTORY — DX: Disorder of kidney and ureter, unspecified: N28.9

## 2018-09-15 NOTE — ED Triage Notes (Signed)
Patient c/o right pelvic pain and urinary retention since 0915. Patient states she has not urinated since 0900 today.

## 2019-04-05 ENCOUNTER — Encounter: Payer: Self-pay | Admitting: Certified Nurse Midwife

## 2019-04-07 ENCOUNTER — Ambulatory Visit: Payer: BLUE CROSS/BLUE SHIELD | Attending: Internal Medicine

## 2019-04-07 DIAGNOSIS — Z23 Encounter for immunization: Secondary | ICD-10-CM

## 2019-04-07 NOTE — Progress Notes (Signed)
   Covid-19 Vaccination Clinic  Name:  Barbara Figueroa    MRN: [id number] DOB: 05-01-1990  04/07/2019  [name] was observed post Covid-19 immunization for 15 minutes without incident. She was provided with Vaccine Information Sheet and instruction to access the V-Safe system.   [name] was instructed to call 911 with any severe reactions post vaccine: Marland Kitchen Difficulty breathing  . Swelling of face and throat  . A fast heartbeat  . A bad rash all over body  . Dizziness and weakness   Immunizations Administered    Name Date Dose VIS Date Route   Pfizer COVID-19 Vaccine 04/07/2019  4:18 PM 0.3 mL 12/23/2018 Intramuscular   Manufacturer: [employer], [employer]   Lot: [id number]   NDC: [address]-4

## 2019-05-03 ENCOUNTER — Ambulatory Visit: Payer: BLUE CROSS/BLUE SHIELD | Attending: Internal Medicine

## 2019-05-03 DIAGNOSIS — Z23 Encounter for immunization: Secondary | ICD-10-CM

## 2019-05-03 NOTE — Progress Notes (Signed)
   Covid-19 Vaccination Clinic  Name:  Barbara Figueroa    MRN: [id number] DOB: 1990-11-10  05/03/2019  Barbara Figueroa was observed post Covid-19 immunization for 15 minutes without incident. She was provided with Vaccine Information Sheet and instruction to access the V-Safe system.   Barbara Figueroa was instructed to call 911 with any severe reactions post vaccine: Marland Kitchen Difficulty breathing  . Swelling of face and throat  . A fast heartbeat  . A bad rash all over body  . Dizziness and weakness   Immunizations Administered    Name Date Dose VIS Date Route   Pfizer COVID-19 Vaccine 05/03/2019  4:10 PM 0.3 mL 03/08/2018 Intramuscular   Manufacturer: [employer], [employer]   Lot: [id number]   NDC: [address]-3

## 2020-01-20 ENCOUNTER — Ambulatory Visit: Payer: No Typology Code available for payment source | Attending: Internal Medicine

## 2020-01-20 DIAGNOSIS — Z23 Encounter for immunization: Secondary | ICD-10-CM

## 2020-01-20 NOTE — Progress Notes (Signed)
   Covid-19 Vaccination Clinic  Name:  AILIN ROCHFORD    MRN: [id number] DOB: October 21, 1990  01/20/2020  Ms. Wires was observed post Covid-19 immunization for 15 minutes without incident. She was provided with Vaccine Information Sheet and instruction to access the V-Safe system.   Ms. Poynor was instructed to call 911 with any severe reactions post vaccine: Marland Kitchen Difficulty breathing  . Swelling of face and throat  . A fast heartbeat  . A bad rash all over body  . Dizziness and weakness   Immunizations Administered    Name Date Dose VIS Date Route   Pfizer COVID-19 Vaccine 01/20/2020 10:52 AM 0.3 mL 11/01/2019 Intramuscular   Manufacturer: [employer], [employer]   Lot: [id number]   NDC: [address]-4

## 2020-04-22 NOTE — Progress Notes (Signed)
30 y.o. G0P0 Single White or Caucasian female here for annual exam.    Stopped taking OCP and did not have periods x 10 months. (was taking OCP continuously x 6 years prior with out menses) Then when she started having periods again and she has severe symptoms. Have had 2 periods (one month apart) and both with Pain, heavy bleeding, breast tenderness, cramps, and emotional symptoms. Interested in going back on birth control. Wants to start pill for now.  Has primary care appointment scheduled in May 2022.   Works for [employer]   Period Duration (Days): 5 Period Pattern: (!) Irregular Menstrual Flow: Heavy Menstrual Control: Tampon,Panty liner Dysmenorrhea: (!) Moderate Dysmenorrhea Symptoms: Cramping,Nausea Patient's last menstrual period was 03/28/2020 (exact date).            Sexually active: No.  The current method of family planning is abstinence.    Exercising: Yes.    walking Smoker:  no  Health Maintenance: Pap:  06-24-15 neg History of abnormal Pap:  no MMG: 12-15-13 Rt breast u/s birads 2:neg Colonoscopy:  none BMD:   none Gardasil:  completed Covid-19: pfizer Hep C testing: neg 2018    reports that she has never smoked. She has never used smokeless tobacco. She reports current alcohol use. She reports current drug use. Drug: Marijuana.  Past Medical History:  Diagnosis Date  . Anxiety   . Depression   . Renal disorder     Past Surgical History:  Procedure Laterality Date  . WISDOM TOOTH EXTRACTION      Current Outpatient Medications  Medication Sig Dispense Refill  . citalopram (CELEXA) 40 MG tablet Take 40 mg by mouth every morning.    . CONCERTA 54 MG CR tablet Take 54 mg by mouth every morning.    . methylphenidate (RITALIN) 10 MG tablet Take 10 mg by mouth daily.     No current facility-administered medications for this visit.    Family History  Problem Relation Age of Onset  . Breast cancer Maternal Grandmother   . Breast cancer Paternal  Grandmother   . Colon cancer Paternal Grandmother     Review of Systems  Constitutional: Negative.   HENT: Negative.   Eyes: Negative.   Respiratory: Negative.   Cardiovascular: Negative.   Gastrointestinal: Negative.   Endocrine: Negative.   Genitourinary: Negative.   Musculoskeletal: Negative.   Skin: Negative.   Allergic/Immunologic: Negative.   Neurological: Negative.   Hematological: Negative.   Psychiatric/Behavioral: Negative.     Exam:   BP 108/64   Pulse 74   Resp 16   Ht 5' 6.75" (1.695 m)   Wt 226 lb (102.5 kg)   LMP 03/28/2020 (Exact Date)   BMI 35.66 kg/m   Height: 5' 6.75" (169.5 cm)  General appearance: alert, cooperative and appears stated age, no acute distress Head: Normocephalic, without obvious abnormality Neck: no adenopathy, unable to palpate mass, but feels mildly fuller on right side Lungs: clear to auscultation bilaterally Breasts: normal appearance, no masses or tenderness Heart: regular rate and rhythm Abdomen: soft, non-tender; no masses,  no organomegaly Extremities: extremities normal, no edema Skin: No rashes or lesions Lymph nodes: Cervical, supraclavicular, and axillary nodes normal. No abnormal inguinal nodes palpated Neurologic: Grossly normal   Pelvic: External genitalia:  no lesions              Urethra:  normal appearing urethra with no masses, tenderness or lesions  Bartholins and Skenes: normal                 Vagina: normal appearing vagina, appropriate for age, normal appearing discharge, no lesions              Cervix: neg cervical motion tenderness, no visible lesions             Bimanual Exam:   Uterus:  normal size, contour, position, consistency, mobility, non-tender              Adnexa: no mass, fullness, tenderness                 Joy, CMA Chaperone was present for exam.  A:  Well woman exam with routine gynecological exam - Plan: Cytology - PAP( [place])  Surveillance of previously prescribed  contraceptive pill - Plan: levonorgestrel-ethinyl estradiol (ALESSE) 0.1-20 MG-MCG tablet  Irregular bleeding - Plan: TSH, Prolactin  Dysmenorrhea/PMS    P:   Pap :collected today  Labs: TSH, Prolactin  Medications: Aviane, 3 packs, 4 RF (continuous)  Discussed IUD Rutha Bouchard), pt will stick with OCP for now

## 2020-04-23 ENCOUNTER — Other Ambulatory Visit (HOSPITAL_COMMUNITY): Admission: RE | Admit: 2020-04-23 | Payer: No Typology Code available for payment source | Source: Ambulatory Visit

## 2020-04-23 ENCOUNTER — Ambulatory Visit (INDEPENDENT_AMBULATORY_CARE_PROVIDER_SITE_OTHER): Payer: 59 | Admitting: Nurse Practitioner

## 2020-04-23 ENCOUNTER — Encounter: Payer: Self-pay | Admitting: Nurse Practitioner

## 2020-04-23 ENCOUNTER — Other Ambulatory Visit: Payer: Self-pay

## 2020-04-23 VITALS — BP 108/64 | HR 74 | Resp 16 | Ht 66.75 in | Wt 226.0 lb

## 2020-04-23 DIAGNOSIS — Z3041 Encounter for surveillance of contraceptive pills: Secondary | ICD-10-CM

## 2020-04-23 DIAGNOSIS — Z01419 Encounter for gynecological examination (general) (routine) without abnormal findings: Secondary | ICD-10-CM | POA: Diagnosis not present

## 2020-04-23 DIAGNOSIS — N946 Dysmenorrhea, unspecified: Secondary | ICD-10-CM

## 2020-04-23 DIAGNOSIS — N926 Irregular menstruation, unspecified: Secondary | ICD-10-CM | POA: Diagnosis not present

## 2020-04-23 MED ORDER — LEVONORGESTREL-ETHINYL ESTRAD 0.1-20 MG-MCG PO TABS: 1.0000 | ORAL_TABLET | Freq: Every day | ORAL | 4 refills | Status: DC

## 2020-04-23 NOTE — Patient Instructions (Signed)
Health Maintenance, Female Adopting a healthy lifestyle and getting preventive care are important in promoting health and wellness. Ask your health care provider about:  The right schedule for you to have regular tests and exams.  Things you can do on your own to prevent diseases and keep yourself healthy. What should I know about diet, weight, and exercise? Eat a healthy diet  Eat a diet that includes plenty of vegetables, fruits, low-fat dairy products, and lean protein.  Do not eat a lot of foods that are high in solid fats, added sugars, or sodium.   Maintain a healthy weight Body mass index (BMI) is used to identify weight problems. It estimates body fat based on height and weight. Your health care provider can help determine your BMI and help you achieve or maintain a healthy weight. Get regular exercise Get regular exercise. This is one of the most important things you can do for your health. Most adults should:  Exercise for at least 150 minutes each week. The exercise should increase your heart rate and make you sweat (moderate-intensity exercise).  Do strengthening exercises at least twice a week. This is in addition to the moderate-intensity exercise.  Spend less time sitting. Even light physical activity can be beneficial. Watch cholesterol and blood lipids Have your blood tested for lipids and cholesterol at 31 years of age, then have this test every 5 years. Have your cholesterol levels checked more often if:  Your lipid or cholesterol levels are high.  You are older than 31 years of age.  You are at high risk for heart disease. What should I know about cancer screening? Depending on your health history and family history, you may need to have cancer screening at various ages. This may include screening for:  Breast cancer.  Cervical cancer.  Colorectal cancer.  Skin cancer.  Lung cancer. What should I know about heart disease, diabetes, and high blood  pressure? Blood pressure and heart disease  High blood pressure causes heart disease and increases the risk of stroke. This is more likely to develop in people who have high blood pressure readings, are of African descent, or are overweight.  Have your blood pressure checked: ? Every 3-5 years if you are 18-39 years of age. ? Every year if you are 40 years old or older. Diabetes Have regular diabetes screenings. This checks your fasting blood sugar level. Have the screening done:  Once every three years after age 40 if you are at a normal weight and have a low risk for diabetes.  More often and at a younger age if you are overweight or have a high risk for diabetes. What should I know about preventing infection? Hepatitis B If you have a higher risk for hepatitis B, you should be screened for this virus. Talk with your health care provider to find out if you are at risk for hepatitis B infection. Hepatitis C Testing is recommended for:  Everyone born from 1945 through 1965.  Anyone with known risk factors for hepatitis C. Sexually transmitted infections (STIs)  Get screened for STIs, including gonorrhea and chlamydia, if: ? You are sexually active and are younger than 31 years of age. ? You are older than 31 years of age and your health care provider tells you that you are at risk for this type of infection. ? Your sexual activity has changed since you were last screened, and you are at increased risk for chlamydia or gonorrhea. Ask your health care provider   if you are at risk.  Ask your health care provider about whether you are at high risk for HIV. Your health care provider may recommend a prescription medicine to help prevent HIV infection. If you choose to take medicine to prevent HIV, you should first get tested for HIV. You should then be tested every 3 months for as long as you are taking the medicine. Pregnancy  If you are about to stop having your period (premenopausal) and  you may become pregnant, seek counseling before you get pregnant.  Take 400 to 800 micrograms (mcg) of folic acid every day if you become pregnant.  Ask for birth control (contraception) if you want to prevent pregnancy. Osteoporosis and menopause Osteoporosis is a disease in which the bones lose minerals and strength with aging. This can result in bone fractures. If you are 65 years old or older, or if you are at risk for osteoporosis and fractures, ask your health care provider if you should:  Be screened for bone loss.  Take a calcium or vitamin D supplement to lower your risk of fractures.  Be given hormone replacement therapy (HRT) to treat symptoms of menopause. Follow these instructions at home: Lifestyle  Do not use any products that contain nicotine or tobacco, such as cigarettes, e-cigarettes, and chewing tobacco. If you need help quitting, ask your health care provider.  Do not use street drugs.  Do not share needles.  Ask your health care provider for help if you need support or information about quitting drugs. Alcohol use  Do not drink alcohol if: ? Your health care provider tells you not to drink. ? You are pregnant, may be pregnant, or are planning to become pregnant.  If you drink alcohol: ? Limit how much you use to 0-1 drink a day. ? Limit intake if you are breastfeeding.  Be aware of how much alcohol is in your drink. In the [place] one drink equals one 12 oz bottle of beer (355 mL), one 5 oz glass of wine (148 mL), or one 1 oz glass of hard liquor (44 mL). General instructions  Schedule regular health, dental, and eye exams.  Stay current with your vaccines.  Tell your health care provider if: ? You often feel depressed. ? You have ever been abused or do not feel safe at home. Summary  Adopting a healthy lifestyle and getting preventive care are important in promoting health and wellness.  Follow your health care provider's instructions about healthy  diet, exercising, and getting tested or screened for diseases.  Follow your health care provider's instructions on monitoring your cholesterol and blood pressure. This information is not intended to replace advice given to you by your health care provider. Make sure you discuss any questions you have with your health care provider. Document Revised: 12/22/2017 Document Reviewed: 12/22/2017 Elsevier Patient Education  2021 Elsevier Inc.  

## 2020-04-24 LAB — PROLACTIN: Prolactin: 7.5 ng/mL

## 2020-04-24 LAB — TSH: TSH: 1.03 mIU/L

## 2020-04-25 NOTE — Progress Notes (Signed)
Please notify Barbara Figueroa that her thyroid test and Prolactin level are normal.

## 2020-04-26 LAB — CYTOLOGY - PAP
Comment: NEGATIVE
Diagnosis: UNDETERMINED — AB
High risk HPV: POSITIVE — AB

## 2020-04-29 NOTE — Progress Notes (Signed)
Please notify that her pap smear was abnormal and HPV test was positive. In her case a colposcopy and possible biopsy is recommend to get her a diagnosis. Please schedule a colposcopy, notify her about the procedure and answer any questions. If this patient has questions that unable to answer, please route back to me and I will call her.

## 2020-05-21 ENCOUNTER — Telehealth: Payer: Self-pay

## 2020-05-21 ENCOUNTER — Other Ambulatory Visit: Payer: Self-pay

## 2020-05-21 DIAGNOSIS — R87810 Cervical high risk human papillomavirus (HPV) DNA test positive: Secondary | ICD-10-CM

## 2020-05-21 DIAGNOSIS — R87610 Atypical squamous cells of undetermined significance on cytologic smear of cervix (ASC-US): Secondary | ICD-10-CM

## 2020-05-21 NOTE — Telephone Encounter (Signed)
-----   Message from Clarita Crane, NP sent at 04/29/2020  8:17 AM EDT ----- Please notify that her pap smear was abnormal and HPV test was positive. In her case a colposcopy and possible biopsy is recommend to get her a diagnosis. Please schedule a colposcopy, notify her about the procedure and answer any questions. If this patient has questions that unable to answer, please route back to me and I will call her.

## 2020-05-21 NOTE — Telephone Encounter (Signed)
Opened in error

## 2020-05-23 ENCOUNTER — Telehealth: Payer: Self-pay | Admitting: *Deleted

## 2020-05-23 NOTE — Telephone Encounter (Signed)
Patient called and left message in triage with questions about abnormal pap smear. Left message for patient to call.

## 2020-05-23 NOTE — Telephone Encounter (Signed)
I called patient back. She said when I originally called her she was [place] the beach and just didn't understand everything. I offered to have Clarita Crane, NP call her back but she said she was fine with me just re-explaining it all to her now that she better able to listen.  I re-explained result and recommendation.  She voiced understanding and asked if she could go ahead and schedule colpo.  I spoke with Cecil Cranker. Who picked up and talked with patient and scheduled appt.

## 2020-05-30 ENCOUNTER — Other Ambulatory Visit (HOSPITAL_COMMUNITY)
Admission: RE | Admit: 2020-05-30 | Discharge: 2020-05-30 | Disposition: A | Discharge status: Home / Self Care | Payer: No Typology Code available for payment source | Source: Ambulatory Visit | Attending: Nurse Practitioner | Admitting: Nurse Practitioner

## 2020-05-30 ENCOUNTER — Other Ambulatory Visit: Payer: Self-pay

## 2020-05-30 ENCOUNTER — Ambulatory Visit (INDEPENDENT_AMBULATORY_CARE_PROVIDER_SITE_OTHER): Payer: 59 | Admitting: Nurse Practitioner

## 2020-05-30 ENCOUNTER — Encounter: Payer: Self-pay | Admitting: Nurse Practitioner

## 2020-05-30 VITALS — BP 114/80 | HR 97 | Resp 16 | Wt 227.0 lb

## 2020-05-30 DIAGNOSIS — R87810 Cervical high risk human papillomavirus (HPV) DNA test positive: Secondary | ICD-10-CM | POA: Diagnosis not present

## 2020-05-30 DIAGNOSIS — Z01812 Encounter for preprocedural laboratory examination: Secondary | ICD-10-CM

## 2020-05-30 DIAGNOSIS — R87610 Atypical squamous cells of undetermined significance on cytologic smear of cervix (ASC-US): Secondary | ICD-10-CM

## 2020-05-30 LAB — PREGNANCY, URINE: Preg Test, Ur: NEGATIVE

## 2020-05-30 NOTE — Patient Instructions (Signed)
Colposcopy, Care After This sheet gives you information about how to care for yourself after your procedure. Your health care provider may also give you more specific instructions. If you have problems or questions, contact your health care provider. What can I expect after the procedure? If you had a colposcopy without a biopsy, you can expect to feel fine right away after your procedure. However, you may have some spotting of blood for a few days. You can return to your normal activities. If you had a colposcopy with a biopsy, it is common after the procedure to have:  Soreness and mild pain. These may last for a few days.  Light-headedness.  Mild vaginal bleeding or discharge that is dark-colored and grainy. This may last for a few days. The discharge may be caused by a liquid (solution) that was used during the procedure. You may need to wear a sanitary pad during this time.  Spotting of blood for at least 48 hours after the procedure. Follow these instructions at home: Medicines  Take over-the-counter and prescription medicines only as told by your health care provider.  Talk with your health care provider about what type of over-the-counter pain medicine and prescription medicine you can start to take again. It is especially important to talk with your health care provider if you take blood thinners. Activity  Limit your physical activity for the first day after your procedure as told by your health care provider.  Avoid using douche products, using tampons, or having sex for at least 3 days after the procedure or for as long as told.  Return to your normal activities as told by your health care provider. Ask your health care provider what activities are safe for you. General instructions  Drink enough fluid to keep your urine pale yellow.  Ask your health care provider if you may take baths, swim, or use a hot tub. You may take showers.  If you use birth control  (contraception), continue to use it.  Keep all follow-up visits as told by your health care provider. This is important.   Contact a health care provider if:  You develop a skin rash. Get help right away if:  You bleed a lot from your vagina or pass blood clots. This includes using more than one sanitary pad each hour for 2 hours in a row.  You have a fever or chills.  You have vaginal discharge that is abnormal, is yellow in color, or smells bad. This could be a sign of infection.  You have severe pain or cramps in your lower abdomen that do not go away with medicine.  You faint. Summary  If you had a colposcopy without a biopsy, you can expect to feel fine right away, but you may have some spotting of blood for a few days. You can return to your normal activities.  If you had a colposcopy with a biopsy, it is common to have mild pain for a few days and spotting for 48 hours after the procedure.  Avoid using douche products, using tampons, and having sex for at least 3 days after the procedure or for as long as told by your health care provider.  Get help right away if you have heavy bleeding, severe pain, or signs of infection. This information is not intended to replace advice given to you by your health care provider. Make sure you discuss any questions you have with your health care provider. Document Revised: 12/28/2018 Document Reviewed: 12/28/2018 Elsevier   Patient Education  2021 Elsevier Inc.  

## 2020-05-30 NOTE — Progress Notes (Signed)
Here for Colposcopy  ASCUS pap, Positive HR HPV on 04/23/2020  Physical Exam Genitourinary:      No punctation, no mosaicism, no inflammation, no abnormal vessels Small area of acetowhite epithelium at 7 o'clock  Impression: CIN 1 Assessment: Cervical biopsy 7 o'clock, No ECC Plan: F/U pending pathology

## 2020-06-03 LAB — SURGICAL PATHOLOGY

## 2020-06-04 NOTE — Progress Notes (Signed)
Please notify patient that the biopsy showed benign cells (no evidence of cancer or pre-cancer). The cells that were seen with the colposcope were largely glandular cells that all looked very healthy. The cells identified on the pap smear that were "atypical" were squamous cells. Those are the cells that appear on the surface of the cervix. I did not see any areas concerning enough to biopsy. The bottom line is that every thing looked reassuring and we will continue to monitor closely. The next step is to repeat your pap smear with your next annual exam in 1 year.

## 2021-07-17 NOTE — Progress Notes (Signed)
31 y.o. G0P0 Single White or Caucasian Not Hispanic or Latino female here for annual exam.   She has ran out of birth control and has not had a cycle since March.  She has not been sexually active since February. Prior to OCP's she was having regular cycles. Last time she went off of OCP's  she didn't have a cycle x 9 months. No thyroid c/o, no galactorrhea, no weight changes.  Period Pattern: (!) Irregular  No bowel or bladder c/o.   Patient's last menstrual period was 04/04/2021.          Sexually active: Yes.    The current method of family planning is none.    Exercising: Yes.     Light cardio and weights  Smoker:  no  Health Maintenance: Pap: 04/26/20  ASCUS Hr HPV +  06-24-15 neg History of abnormal Pap:  yes had colpo in 2022, negative biopsy  MMG: 12-15-13 Rt breast u/s birads 2:neg Colonoscopy:  none BMD:   none T-Dap: 07/14/17  Gardasil:  completed   reports that she has never smoked. She has never used smokeless tobacco. She reports current alcohol use. She reports current drug use. Drug: Marijuana. Just occasional ETOH. Works in Airline pilot for [employer].   Past Medical History:  Diagnosis Date   Anxiety    Depression    Renal disorder     Past Surgical History:  Procedure Laterality Date   WISDOM TOOTH EXTRACTION      No current outpatient medications on file.   No current facility-administered medications for this visit.    Family History  Problem Relation Age of Onset   Breast cancer Maternal Grandmother    Breast cancer Paternal Grandmother    Colon cancer Paternal Grandmother     Review of Systems  Genitourinary:  Positive for menstrual problem.  All other systems reviewed and are negative.   Exam:   BP 122/68   Pulse 72   Ht 5\' 6"  (1.676 m)   Wt 236 lb (107 kg)   LMP 04/04/2021   SpO2 100%   BMI 38.09 kg/m   Weight change: @WEIGHTCHANGE @ Height:   Height: 5\' 6"  (167.6 cm)  Ht Readings from Last 3 Encounters:  07/24/21 5\' 6"  (1.676 m)   04/23/20 5' 6.75" (1.695 m)  07/14/17 5\' 6"  (1.676 m)    General appearance: alert, cooperative and appears stated age Head: Normocephalic, without obvious abnormality, atraumatic Neck: no adenopathy, supple, symmetrical, trachea midline and thyroid normal to inspection and palpation Lungs: clear to auscultation bilaterally Cardiovascular: regular rate and rhythm Breasts: normal appearance, no masses or tenderness Abdomen: soft, non-tender; non distended,  no masses,  no organomegaly Extremities: extremities normal, atraumatic, no cyanosis or edema Skin: Skin color, texture, turgor normal. No rashes or lesions Lymph nodes: Cervical, supraclavicular, and axillary nodes normal. No abnormal inguinal nodes palpated Neurologic: Grossly normal   Pelvic: External genitalia:  no lesions              Urethra:  normal appearing urethra with no masses, tenderness or lesions              Bartholins and Skenes: normal                 Vagina: normal appearing vagina with normal color and discharge, no lesions              Cervix: no lesions               Bimanual  Exam:  Uterus:   anteverted, no masses or tenderness              Adnexa: no mass, fullness, tenderness               Rectovaginal: Confirms               Anus:  normal sphincter tone, no lesions  Carolynn Serve, CMA chaperoned for the exam.  1. Well woman exam Discussed breast self exam Discussed calcium and vit D intake Screening labs with her primary  2. Amenorrhea - Pregnancy, urine - TSH - Prolactin - medroxyPROGESTERone (PROVERA) 5 MG tablet; Take 1 tablet (5 mg total) by mouth daily.  Dispense: 5 tablet; Refill: 0  3. Screening for cervical cancer - Cytology - PAP  4. General counseling and advice on female contraception She will start the pill on the first day of her cycle, after taking the provera. No contraindications, risks reviewed - levonorgestrel-ethinyl estradiol (ALESSE) 0.1-20 MG-MCG tablet; Take 1 tablet by  mouth daily.  Dispense: 84 tablet; Refill: 3

## 2021-07-24 ENCOUNTER — Encounter: Payer: Self-pay | Admitting: Obstetrics and Gynecology

## 2021-07-24 ENCOUNTER — Ambulatory Visit (INDEPENDENT_AMBULATORY_CARE_PROVIDER_SITE_OTHER): Payer: Commercial Managed Care - HMO | Admitting: Obstetrics and Gynecology

## 2021-07-24 ENCOUNTER — Other Ambulatory Visit (HOSPITAL_COMMUNITY)
Admission: RE | Admit: 2021-07-24 | Discharge: 2021-07-24 | Disposition: A | Discharge status: Home / Self Care | Payer: Commercial Managed Care - HMO | Source: Ambulatory Visit | Attending: Obstetrics and Gynecology | Admitting: Obstetrics and Gynecology

## 2021-07-24 VITALS — BP 122/68 | HR 72 | Ht 66.0 in | Wt 236.0 lb

## 2021-07-24 DIAGNOSIS — M545 Low back pain, unspecified: Secondary | ICD-10-CM | POA: Insufficient documentation

## 2021-07-24 DIAGNOSIS — Z124 Encounter for screening for malignant neoplasm of cervix: Secondary | ICD-10-CM | POA: Diagnosis present

## 2021-07-24 DIAGNOSIS — N912 Amenorrhea, unspecified: Secondary | ICD-10-CM | POA: Diagnosis not present

## 2021-07-24 DIAGNOSIS — Z3009 Encounter for other general counseling and advice on contraception: Secondary | ICD-10-CM

## 2021-07-24 DIAGNOSIS — Z01419 Encounter for gynecological examination (general) (routine) without abnormal findings: Secondary | ICD-10-CM | POA: Diagnosis not present

## 2021-07-24 DIAGNOSIS — F3340 Major depressive disorder, recurrent, in remission, unspecified: Secondary | ICD-10-CM | POA: Insufficient documentation

## 2021-07-24 DIAGNOSIS — E7800 Pure hypercholesterolemia, unspecified: Secondary | ICD-10-CM | POA: Insufficient documentation

## 2021-07-24 DIAGNOSIS — F909 Attention-deficit hyperactivity disorder, unspecified type: Secondary | ICD-10-CM | POA: Insufficient documentation

## 2021-07-24 LAB — PREGNANCY, URINE: Preg Test, Ur: NEGATIVE

## 2021-07-24 MED ORDER — LEVONORGESTREL-ETHINYL ESTRAD 0.1-20 MG-MCG PO TABS: 1.0000 | ORAL_TABLET | Freq: Every day | ORAL | 3 refills | Status: DC

## 2021-07-24 MED ORDER — MEDROXYPROGESTERONE ACETATE 5 MG PO TABS: 5.0000 mg | ORAL_TABLET | Freq: Every day | ORAL | 0 refills | Status: DC

## 2021-07-24 NOTE — Patient Instructions (Signed)
EXERCISE   We recommended that you start or continue a regular exercise program for good health. Physical activity is anything that gets your body moving, some is better than none. The CDC recommends 150 minutes per week of Moderate-Intensity Aerobic Activity and 2 or more days of Muscle Strengthening Activity.  Benefits of exercise are limitless: helps weight loss/weight maintenance, improves mood and energy, helps with depression and anxiety, improves sleep, tones and strengthens muscles, improves balance, improves bone density, protects from chronic conditions such as heart disease, high blood pressure and diabetes and so much more. To learn more visit: https://www.cdc.gov/physicalactivity/index.html  DIET: Good nutrition starts with a healthy diet of fruits, vegetables, whole grains, and lean protein sources. Drink plenty of water for hydration. Minimize empty calories, sodium, sweets. For more information about dietary recommendations visit: https://health.gov/our-work/nutrition-physical-activity/dietary-guidelines and https://www.myplate.gov/  ALCOHOL:  Women should limit their alcohol intake to no more than 7 drinks/beers/glasses of wine (combined, not each!) per week. Moderation of alcohol intake to this level decreases your risk of breast cancer and liver damage.  If you are concerned that you may have a problem, or your friends have told you they are concerned about your drinking, there are many resources to help. A well-known program that is free, effective, and available to all people all over the nation is Alcoholics Anonymous.  Check out this site to learn more: https://www.aa.org/   CALCIUM AND VITAMIN D:  Adequate intake of calcium and Vitamin D are recommended for bone health.  You should be getting between 1000-1200 mg of calcium and 800 units of Vitamin D daily between diet and supplements  PAP SMEARS:  Pap smears, to check for cervical cancer or precancers,  have traditionally been  done yearly, scientific advances have shown that most women can have pap smears less often.  However, every woman still should have a physical exam from her gynecologist every year. It will include a breast check, inspection of the vulva and vagina to check for abnormal growths or skin changes, a visual exam of the cervix, and then an exam to evaluate the size and shape of the uterus and ovaries. We will also provide age appropriate advice regarding health maintenance, like when you should have certain vaccines, screening for sexually transmitted diseases, bone density testing, colonoscopy, mammograms, etc.   MAMMOGRAMS:  All women over 40 years old should have a routine mammogram.   COLON CANCER SCREENING: Now recommend starting at age 45. At this time colonoscopy is not covered for routine screening until 50. There are take home tests that can be done between 45-49.   COLONOSCOPY:  Colonoscopy to screen for colon cancer is recommended for all women at age 50.  We know, you hate the idea of the prep.  We agree, BUT, having colon cancer and not knowing it is worse!!  Colon cancer so often starts as a polyp that can be seen and removed at colonscopy, which can quite literally save your life!  And if your first colonoscopy is normal and you have no family history of colon cancer, most women don't have to have it again for 10 years.  Once every ten years, you can do something that may end up saving your life, right?  We will be happy to help you get it scheduled when you are ready.  Be sure to check your insurance coverage so you understand how much it will cost.  It may be covered as a preventative service at no cost, but you should check   your particular policy.      Breast Self-Awareness Breast self-awareness means being familiar with how your breasts look and feel. It involves checking your breasts regularly and reporting any changes to your health care provider. Practicing breast self-awareness is  important. A change in your breasts can be a sign of a serious medical problem. Being familiar with how your breasts look and feel allows you to find any problems early, when treatment is more likely to be successful. All women should practice breast self-awareness, including women who have had breast implants. How to do a breast self-exam One way to learn what is normal for your breasts and whether your breasts are changing is to do a breast self-exam. To do a breast self-exam: Look for Changes  Remove all the clothing above your waist. Stand in front of a mirror in a room with good lighting. Put your hands on your hips. Push your hands firmly downward. Compare your breasts in the mirror. Look for differences between them (asymmetry), such as: Differences in shape. Differences in size. Puckers, dips, and bumps in one breast and not the other. Look at each breast for changes in your skin, such as: Redness. Scaly areas. Look for changes in your nipples, such as: Discharge. Bleeding. Dimpling. Redness. A change in position. Feel for Changes Carefully feel your breasts for lumps and changes. It is best to do this while lying on your back on the floor and again while sitting or standing in the shower or tub with soapy water on your skin. Feel each breast in the following way: Place the arm on the side of the breast you are examining above your head. Feel your breast with the other hand. Start in the nipple area and make  inch (2 cm) overlapping circles to feel your breast. Use the pads of your three middle fingers to do this. Apply light pressure, then medium pressure, then firm pressure. The light pressure will allow you to feel the tissue closest to the skin. The medium pressure will allow you to feel the tissue that is a little deeper. The firm pressure will allow you to feel the tissue close to the ribs. Continue the overlapping circles, moving downward over the breast until you feel your  ribs below your breast. Move one finger-width toward the center of the body. Continue to use the  inch (2 cm) overlapping circles to feel your breast as you move slowly up toward your collarbone. Continue the up and down exam using all three pressures until you reach your armpit.  Write Down What You Find  Write down what is normal for each breast and any changes that you find. Keep a written record with breast changes or normal findings for each breast. By writing this information down, you do not need to depend only on memory for size, tenderness, or location. Write down where you are in your menstrual cycle, if you are still menstruating. If you are having trouble noticing differences in your breasts, do not get discouraged. With time you will become more familiar with the variations in your breasts and more comfortable with the exam. How often should I examine my breasts? Examine your breasts every month. If you are breastfeeding, the best time to examine your breasts is after a feeding or after using a breast pump. If you menstruate, the best time to examine your breasts is 5-7 days after your period is over. During your period, your breasts are lumpier, and it may be more   difficult to notice changes. When should I see my health care provider? See your health care provider if you notice: A change in shape or size of your breasts or nipples. A change in the skin of your breast or nipples, such as a reddened or scaly area. Unusual discharge from your nipples. A lump or thick area that was not there before. Pain in your breasts. Anything that concerns you. Oral Contraception Information Oral contraceptive pills (OCPs) are medicines taken by mouth to prevent pregnancy. They work by: Preventing the ovaries from releasing eggs. Thickening mucus in the lower part of the uterus (cervix). This prevents sperm from entering the uterus. Thinning the lining of the uterus (endometrium). This prevents a  fertilized egg from attaching to the endometrium. OCPs are highly effective when taken exactly as prescribed. However, OCPs do not prevent STIs (sexually transmitted infections). Using condoms while on an OCP can help prevent STIs. What happens before starting OCPs? Before you start taking OCPs: You may have a physical exam, blood test, and Pap test. Your health care provider will make sure you are a good candidate for oral contraception. OCPs are not a good option for certain women, such as: Women who smoke and are older than age 35. Women who have or have had certain conditions, such as: A history of high blood pressure. Deep vein thrombosis. Pulmonary embolism. Stroke. Cardiovascular disease. Peripheral vascular disease. Ask your health care provider about the possible side effects of the OCP you may be prescribed. Be aware that it can take 2-3 months for your body to adjust to changes in hormone levels. Types of oral contraception  Birth control pills contain the hormones estrogen and progestin (synthetic progesterone) or progestin only. The combination pill This type of pill contains estrogen and progestin hormones. Conventional contraception pills come in packs of 21 or 28 pills. Some packs with 28-day pills contain estrogen and progestin for the first 21-24 days. Hormone-free tablets, called placebos, are taken for the final 4-7 days. You should have menstrual bleeding during the time you take the placebos. In packs with 21 tablets, you take no pills for 7 days. Menstrual bleeding occurs during these days. (Some people prefer taking a pill for 28 days to help establish a routine). Extended-interval contraception pills come in packs of 91 pills. The first 84 tablets have both estrogen and progestin. The last 7 pills are placebos. Menstrual bleeding occurs during the placebo days. With this schedule, menstrual bleeding happens once every 3 months. Continuous contraception pills come in  packs of 28 pills. All pills in the pack contain estrogen and progestin. With this schedule, regular menstrual bleeding does not happen, but there may be spotting or irregular bleeding. Progestin-only pills This type of pill is often called the mini-pill and contains the progestin hormone only. It comes in packs of 28 pills. In some packs, the last 4 pills are placebos. The pill must be taken at the same time every day. This is very important to prevent pregnancy. Menstrual bleeding may not be regular or predictable. What are the advantages? Oral contraception provides reliable and continuous contraception if taken as directed. It may treat or decrease symptoms of: Menstrual period cramps. Irregular menstrual cycle or bleeding. Heavy menstrual flow. Abnormal uterine bleeding. Acne, depending on the type of pill. Polycystic ovarian syndrome (POS). Endometriosis. Iron deficiency anemia. Premenstrual symptoms, including severe irritability, depression, or anxiety. It also may: Reduce the risk of endometrial and ovarian cancer. Be used as emergency contraception. Prevent ectopic   pregnancies and infections of the fallopian tubes. What can make OCPs less effective? OCPs may be less effective if: You forget to take the pill every day. For progestin-only pills, it is especially important to take the pill at the same time each day. Even taking it 3 hours late can increase the risk of pregnancy. You have a stomach or intestinal disease that reduces your body's ability to absorb the pill. You take OCPs with other medicines that make OCPs less effective, such as antibiotics, certain HIV medicines, and some seizure medicines. You take expired OCPs. You forget to restart the pill after 7 days of not taking it. This refers to the packs of 21 pills. What are the side effects and risks? OCPs can sometimes cause side effects, such as: Headache. Depression. Trouble sleeping. Nausea and vomiting. Breast  tenderness. Irregular bleeding or spotting during the first several months. Bloating or fluid retention. Increase in blood pressure. Combination pills may slightly increase the risk of: Blood clots. Heart attack. Stroke. Follow these instructions at home: Follow instructions from your health care provider about how to start taking your first cycle of OCPs. Depending on when you start the pill, you may need to use a backup form of birth control, such as condoms, during the first week. Make sure you know what steps to take if you forget to take the pill. Summary Oral contraceptive pills (OCPs) are medicines taken by mouth to prevent pregnancy. They are highly effective when taken exactly as prescribed. OCPs contain a combination of the hormones estrogen and progestin (synthetic progesterone) or progestin only. Before you start taking the pill, you may have a physical exam, blood test, and Pap test. Your health care provider will make sure you are a good candidate for oral contraception. The combination pill may come in a 21-day pack, a 28-day pack, or a 91-day pack. Progestin-only pills come in packs of 28 pills. OCPs can sometimes cause side effects, such as headache, nausea, breast tenderness, or irregular bleeding. This information is not intended to replace advice given to you by your health care provider. Make sure you discuss any questions you have with your health care provider. Document Revised: 09/29/2019 Document Reviewed: 09/07/2019 Elsevier Patient Education  2023 [employer] Inc.  

## 2021-07-25 LAB — TSH: TSH: 2.43 mIU/L

## 2021-07-25 LAB — PROLACTIN: Prolactin: 8.9 ng/mL

## 2021-07-29 LAB — CYTOLOGY - PAP
Comment: NEGATIVE
Diagnosis: NEGATIVE
High risk HPV: POSITIVE — AB

## 2021-08-27 ENCOUNTER — Telehealth: Payer: Self-pay

## 2021-08-27 NOTE — Telephone Encounter (Signed)
I agree. She should continue to take her pills around the same time daily, BTB should resolve over the next 2 cycles.

## 2021-08-27 NOTE — Telephone Encounter (Signed)
At 07/24/21 office visit patient was prescribed Provera to bring on menses. When her menses began she started the bcp as instructed.  She had had no late or missed bcps. She has had spotting every day since she started. Now she is nearing end of pack and has 3 or 4 active pills left and she is having very light bleeding but more than spotting.   I reassured her and explained BTB the most common side affect in the first 3 packs of bcp's. Recommended taking pill every day and not missing any. (She has not.)  I told her I will send her info to Dr. Oscar La to see what she recommends.

## 2021-08-27 NOTE — Telephone Encounter (Signed)
Spoke with patient and informed her. °

## 2022-02-02 DIAGNOSIS — L0202 Furuncle of face: Secondary | ICD-10-CM | POA: Diagnosis not present

## 2022-02-02 DIAGNOSIS — B9689 Other specified bacterial agents as the cause of diseases classified elsewhere: Secondary | ICD-10-CM | POA: Diagnosis not present

## 2022-11-05 ENCOUNTER — Other Ambulatory Visit (HOSPITAL_COMMUNITY)
Admission: RE | Admit: 2022-11-05 | Discharge: 2022-11-05 | Disposition: A | Discharge status: Home / Self Care | Payer: BC Managed Care – PPO | Source: Ambulatory Visit | Attending: Obstetrics and Gynecology | Admitting: Obstetrics and Gynecology

## 2022-11-05 ENCOUNTER — Ambulatory Visit (INDEPENDENT_AMBULATORY_CARE_PROVIDER_SITE_OTHER): Payer: BC Managed Care – PPO | Admitting: Obstetrics and Gynecology

## 2022-11-05 ENCOUNTER — Encounter: Payer: Self-pay | Admitting: Obstetrics and Gynecology

## 2022-11-05 VITALS — BP 122/82 | Ht 66.5 in | Wt 244.0 lb

## 2022-11-05 DIAGNOSIS — Z01419 Encounter for gynecological examination (general) (routine) without abnormal findings: Secondary | ICD-10-CM | POA: Insufficient documentation

## 2022-11-05 DIAGNOSIS — E66812 Obesity, class 2: Secondary | ICD-10-CM | POA: Insufficient documentation

## 2022-11-05 DIAGNOSIS — F909 Attention-deficit hyperactivity disorder, unspecified type: Secondary | ICD-10-CM

## 2022-11-05 DIAGNOSIS — Z3009 Encounter for other general counseling and advice on contraception: Secondary | ICD-10-CM

## 2022-11-05 DIAGNOSIS — F411 Generalized anxiety disorder: Secondary | ICD-10-CM | POA: Insufficient documentation

## 2022-11-05 DIAGNOSIS — R87810 Cervical high risk human papillomavirus (HPV) DNA test positive: Secondary | ICD-10-CM | POA: Insufficient documentation

## 2022-11-05 DIAGNOSIS — N926 Irregular menstruation, unspecified: Secondary | ICD-10-CM | POA: Insufficient documentation

## 2022-11-05 DIAGNOSIS — E785 Hyperlipidemia, unspecified: Secondary | ICD-10-CM | POA: Diagnosis not present

## 2022-11-05 DIAGNOSIS — R635 Abnormal weight gain: Secondary | ICD-10-CM | POA: Diagnosis not present

## 2022-11-05 DIAGNOSIS — F3340 Major depressive disorder, recurrent, in remission, unspecified: Secondary | ICD-10-CM

## 2022-11-05 LAB — PREGNANCY, URINE: Preg Test, Ur: NEGATIVE

## 2022-11-05 MED ORDER — HYDROXYZINE HCL 10 MG PO TABS: 25.0000 mg | ORAL_TABLET | Freq: Four times a day (QID) | ORAL | 0 refills | Status: AC | PRN

## 2022-11-05 MED ORDER — HYDROXYZINE HCL 10 MG PO TABS: 10.0000 mg | ORAL_TABLET | Freq: Three times a day (TID) | ORAL | 0 refills | Status: DC | PRN

## 2022-11-05 MED ORDER — CITALOPRAM HYDROBROMIDE 10 MG PO TABS: 10.0000 mg | ORAL_TABLET | Freq: Every day | ORAL | 0 refills | Status: DC

## 2022-11-05 MED ORDER — LEVONORGESTREL-ETHINYL ESTRAD 0.1-20 MG-MCG PO TABS: 1.0000 | ORAL_TABLET | Freq: Every day | ORAL | 3 refills | Status: DC

## 2022-11-05 MED ORDER — CITALOPRAM HYDROBROMIDE 20 MG PO TABS: 20.0000 mg | ORAL_TABLET | Freq: Every day | ORAL | 11 refills | Status: DC

## 2022-11-05 NOTE — Progress Notes (Signed)
32 y.o. G0P0 female with history of abnormal PAP and secondary amenorrhea (2023) here for annual exam. Single. Dating. 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  Patient's last menstrual period was 05/24/2022 (exact date).  Wants to discuss restarting OCPs. Complains of irregular periods (skips cycles) when off OCPs.  At last annual, given progestin challenge and restarted on COC. Had normal cycles while taking, however stopped because she as not sexually active. Notes poor sleep and ongoing anxiety H/o depression./anxiety/ADHD, has seen a therapist on and off. Has been on medications previously but stopped seeing a psychiatrist when her insurance changed. 80lb weight gain noted over past 5 years  Abnormal bleeding: yes, irregular cycles Pelvic discharge or pain: none Breast mass, nipple discharge or skin changes : none Birth control: none Last PAP:     Component Value Date/Time   DIAGPAP  07/24/2021 0826    - Negative for intraepithelial lesion or malignancy (NILM)   DIAGPAP (A) 04/23/2020 1458    - Atypical squamous cells of undetermined significance (ASC-US)   HPVHIGH Positive (A) 07/24/2021 0826   HPVHIGH Positive (A) 04/23/2020 1458   ADEQPAP  07/24/2021 0826    Satisfactory for evaluation; transformation zone component PRESENT.   ADEQPAP  04/23/2020 1458    Satisfactory for evaluation; transformation zone component PRESENT.   Gardasil: unknown Sexually active: no  Exercising: works out 3 days a week  GYN HISTORY: Abnormal PAP 2022, colposcopy with benign ECC  OB History  Gravida Para Term Preterm AB Living  0            SAB IAB Ectopic Multiple Live Births               Past Medical History:  Diagnosis Date   Anxiety    Depression    Renal disorder     Past Surgical History:  Procedure Laterality Date   WISDOM TOOTH EXTRACTION      No current outpatient medications on file prior to visit.   No current  facility-administered medications on file prior to visit.    Social History   Socioeconomic History   Marital status: Single    Spouse name: Not on file   Number of children: Not on file   Years of education: Not on file   Highest education level: Not on file  Occupational History   Not on file  Tobacco Use   Smoking status: Never    Passive exposure: Current   Smokeless tobacco: Never  Vaping Use   Vaping status: Never Used  Substance and Sexual Activity   Alcohol use: Yes    Comment: 3-5 q 2 weeks   Drug use: Not Currently    Types: Marijuana    Comment: occ   Sexual activity: Not Currently    Partners: Male    Birth control/protection: Abstinence, None  Other Topics Concern   Not on file  Social History Narrative   Not on file   Social Determinants of Health   Financial Resource Strain: Not on file  Food Insecurity: Not on file  Transportation Needs: Not on file  Physical Activity: Not on file  Stress: Not on file  Social Connections: Not on file  Intimate Partner Violence: Not on file    Family History  Problem Relation Age of Onset   Breast cancer Maternal Grandmother    Breast cancer Paternal Grandmother    Colon cancer Paternal Grandmother     No Known Allergies  PE Today's Vitals   11/05/22 0821  BP: 122/82  Weight: 244 lb (110.7 kg)  Height: 5' 6.5" (1.689 m)   Body mass index is 38.79 kg/m.  Physical Exam Vitals reviewed. Exam conducted with a chaperone present.  Constitutional:      General: She is not in acute distress.    Appearance: Normal appearance.  HENT:     Head: Normocephalic and atraumatic.     Nose: Nose normal.  Eyes:     Extraocular Movements: Extraocular movements intact.     Conjunctiva/sclera: Conjunctivae normal.  Neck:     Thyroid: No thyroid mass, thyromegaly or thyroid tenderness.  Pulmonary:     Effort: Pulmonary effort is normal.  Chest:     Chest wall: No mass or tenderness.  Breasts:    Right:  Normal. No swelling, mass, nipple discharge, skin change or tenderness.     Left: Normal. No swelling, mass, nipple discharge, skin change or tenderness.  Abdominal:     General: There is no distension.     Palpations: Abdomen is soft.     Tenderness: There is no abdominal tenderness.  Genitourinary:    General: Normal vulva.     Exam position: Lithotomy position.     Urethra: No prolapse.     Vagina: Normal. No vaginal discharge or bleeding.     Cervix: Friability present. No lesion.     Uterus: Normal. Not enlarged and not tender.      Adnexa: Right adnexa normal and left adnexa normal.  Musculoskeletal:        General: Normal range of motion.     Cervical back: Normal range of motion.  Lymphadenopathy:     Upper Body:     Right upper body: No axillary adenopathy.     Left upper body: No axillary adenopathy.     Lower Body: No right inguinal adenopathy. No left inguinal adenopathy.  Skin:    General: Skin is warm and dry.  Neurological:     General: No focal deficit present.     Mental Status: She is alert.  Psychiatric:        Mood and Affect: Mood normal.        Behavior: Behavior normal.       Assessment and Plan:        Well woman exam with routine gynecological exam Assessment & Plan: Cervical cancer screening performed according to ASCCP guidelines. Labs and immunizations with her primary Encouraged safe sexual practices as indicated Encouraged healthy lifestyle practices with diet and exercise For patients under 50yo, I recommend 1000mg  calcium daily and 600IU of vitamin D daily.   Orders: -     CBC  Cervical high risk HPV (human papillomavirus) test positive -     Cytology - PAP  Irregular periods Assessment & Plan: TSH, PRL normal last year 80lb weight gained noted over last 5 years Suspect obesity induced, however will check testosterone levels Restart COC  Orders: -     Pregnancy, urine -     Testos,Total,Free and SHBG (Female)  General  counseling and advice on female contraception -     Levonorgestrel-Ethinyl Estrad; Take 1 tablet by mouth daily.  Dispense: 84 tablet; Refill: 3  Class 2 obesity due to excess calories without serious comorbidity with body mass index (BMI) of 38.0 to 38.9 in adult Assessment & Plan: Encouraged healthy diet and exercise.   Orders: -     Lipid panel -     Hemoglobin A1C w/out  eAG  Attention deficit hyperactivity disorder (ADHD), unspecified ADHD type -     Ambulatory referral to Psychiatry  Recurrent major depression in remission [facility]) Assessment & Plan: Will restart celexa 10mg   >20mg  every day SSRIs can have side effects such as hypotension, mood irritability, GI upset, weight gain, sexual dysfunction, and drowsiness. Suicidal ideation can happpen in a subset of patient, and we reviewed that the medication would need to be stopped for this reason. Psych referral placed due to hx of ADHD as well RTO in 1 month for med check  Orders: -     Ambulatory referral to Psychiatry -     Citalopram Hydrobromide; Take 1 tablet (10 mg total) by mouth daily for 7 days.  Dispense: 7 tablet; Refill: 0 -     Citalopram Hydrobromide; Take 1 tablet (20 mg total) by mouth daily. Start after taking 10mg  tablet for 7 days.  Dispense: 30 tablet; Refill: 11  GAD (generalized anxiety disorder) Assessment & Plan: Trial atarax for PRN anxiety, can also use nightly If sleep symptoms not improved, consider trazadone  Orders: -     Ambulatory referral to Psychiatry -     Citalopram Hydrobromide; Take 1 tablet (10 mg total) by mouth daily for 7 days.  Dispense: 7 tablet; Refill: 0 -     Citalopram Hydrobromide; Take 1 tablet (20 mg total) by mouth daily. Start after taking 10mg  tablet for 7 days.  Dispense: 30 tablet; Refill: 11 -     hydrOXYzine HCl; Take 2.5 tablets (25 mg total) by mouth every 6 (six) hours as needed for anxiety.  Dispense: 30 tablet; Refill: 0    Rosalyn Gess, MD

## 2022-11-05 NOTE — Assessment & Plan Note (Signed)
TSH, PRL normal last year 80lb weight gained noted over last 5 years Suspect obesity induced, however will check testosterone levels Restart COC

## 2022-11-05 NOTE — Assessment & Plan Note (Signed)
Cervical cancer screening performed according to ASCCP guidelines. Labs and immunizations with her primary Encouraged safe sexual practices as indicated Encouraged healthy lifestyle practices with diet and exercise For patients under 33yo, I recommend 1000mg  calcium daily and 600IU of vitamin D daily.

## 2022-11-05 NOTE — Assessment & Plan Note (Signed)
Trial atarax for PRN anxiety, can also use nightly If sleep symptoms not improved, consider trazadone

## 2022-11-05 NOTE — Assessment & Plan Note (Signed)
Will restart celexa 10mg   >20mg  every day SSRIs can have side effects such as hypotension, mood irritability, GI upset, weight gain, sexual dysfunction, and drowsiness. Suicidal ideation can happpen in a subset of patient, and we reviewed that the medication would need to be stopped for this reason. Psych referral placed due to hx of ADHD as well RTO in 1 month for med check

## 2022-11-05 NOTE — Patient Instructions (Addendum)
Recommend daily exercise and melatonin use to help with sleep.  Health Maintenance, Female Adopting a healthy lifestyle and getting preventive care are important in promoting health and wellness. Ask your health care provider about: The right schedule for you to have regular tests and exams. Things you can do on your own to prevent diseases and keep yourself healthy. What should I know about diet, weight, and exercise? Eat a healthy diet  Eat a diet that includes plenty of vegetables, fruits, low-fat dairy products, and lean protein. Do not eat a lot of foods that are high in solid fats, added sugars, or sodium. Maintain a healthy weight Body mass index (BMI) is used to identify weight problems. It estimates body fat based on height and weight. Your health care provider can help determine your BMI and help you achieve or maintain a healthy weight. Get regular exercise Get regular exercise. This is one of the most important things you can do for your health. Most adults should: Exercise for at least 150 minutes each week. The exercise should increase your heart rate and make you sweat (moderate-intensity exercise). Do strengthening exercises at least twice a week. This is in addition to the moderate-intensity exercise. Spend less time sitting. Even light physical activity can be beneficial. Watch cholesterol and blood lipids Have your blood tested for lipids and cholesterol at 33 years of age, then have this test every 5 years. Have your cholesterol levels checked more often if: Your lipid or cholesterol levels are high. You are older than 33 years of age. You are at high risk for heart disease. What should I know about cancer screening? Depending on your health history and family history, you may need to have cancer screening at various ages. This may include screening for: Breast cancer. Cervical cancer. Colorectal cancer. Skin cancer. Lung cancer. What should I know about heart  disease, diabetes, and high blood pressure? Blood pressure and heart disease High blood pressure causes heart disease and increases the risk of stroke. This is more likely to develop in people who have high blood pressure readings or are overweight. Have your blood pressure checked: Every 3-5 years if you are 28-66 years of age. Every year if you are 38 years old or older. Diabetes Have regular diabetes screenings. This checks your fasting blood sugar level. Have the screening done: Once every three years after age 66 if you are at a normal weight and have a low risk for diabetes. More often and at a younger age if you are overweight or have a high risk for diabetes. What should I know about preventing infection? Hepatitis B If you have a higher risk for hepatitis B, you should be screened for this virus. Talk with your health care provider to find out if you are at risk for hepatitis B infection. Hepatitis C Testing is recommended for: Everyone born from [birth year] through 1966. Anyone with known risk factors for hepatitis C. Sexually transmitted infections (STIs) Get screened for STIs, including gonorrhea and chlamydia, if: You are sexually active and are younger than 33 years of age. You are older than 33 years of age and your health care provider tells you that you are at risk for this type of infection. Your sexual activity has changed since you were last screened, and you are at increased risk for chlamydia or gonorrhea. Ask your health care provider if you are at risk. Ask your health care provider about whether you are at high risk for HIV. Your health  care provider may recommend a prescription medicine to help prevent HIV infection. If you choose to take medicine to prevent HIV, you should first get tested for HIV. You should then be tested every 3 months for as long as you are taking the medicine. Pregnancy If you are about to stop having your period (premenopausal) and you may become  pregnant, seek counseling before you get pregnant. Take 400 to 800 micrograms (mcg) of folic acid every day if you become pregnant. Ask for birth control (contraception) if you want to prevent pregnancy. Osteoporosis and menopause Osteoporosis is a disease in which the bones lose minerals and strength with aging. This can result in bone fractures. If you are 72 years old or older, or if you are at risk for osteoporosis and fractures, ask your health care provider if you should: Be screened for bone loss. Take a calcium or vitamin D supplement to lower your risk of fractures. Be given hormone replacement therapy (HRT) to treat symptoms of menopause. Follow these instructions at home: Alcohol use Do not drink alcohol if: Your health care provider tells you not to drink. You are pregnant, may be pregnant, or are planning to become pregnant. If you drink alcohol: Limit how much you have to: 0-1 drink a day. Know how much alcohol is in your drink. In the [place] one drink equals one 12 oz bottle of beer (355 mL), one 5 oz glass of wine (148 mL), or one 1 oz glass of hard liquor (44 mL). Lifestyle Do not use any products that contain nicotine or tobacco. These products include cigarettes, chewing tobacco, and vaping devices, such as e-cigarettes. If you need help quitting, ask your health care provider. Do not use street drugs. Do not share needles. Ask your health care provider for help if you need support or information about quitting drugs. General instructions Schedule regular health, dental, and eye exams. Stay current with your vaccines. Tell your health care provider if: You often feel depressed. You have ever been abused or do not feel safe at home. Summary Adopting a healthy lifestyle and getting preventive care are important in promoting health and wellness. Follow your health care provider's instructions about healthy diet, exercising, and getting tested or screened for  diseases. Follow your health care provider's instructions on monitoring your cholesterol and blood pressure. This information is not intended to replace advice given to you by your health care provider. Make sure you discuss any questions you have with your health care provider. Document Revised: 05/20/2020 Document Reviewed: 05/20/2020 Elsevier Patient Education  2024 [employer].

## 2022-11-05 NOTE — Assessment & Plan Note (Signed)
Encouraged healthy diet and exercise

## 2022-11-06 ENCOUNTER — Ambulatory Visit
Admission: RE | Admit: 2022-11-06 | Discharge: 2022-11-06 | Disposition: A | Discharge status: Home / Self Care | Payer: BC Managed Care – PPO | Source: Ambulatory Visit | Attending: Internal Medicine | Admitting: Internal Medicine

## 2022-11-06 VITALS — BP 118/86 | HR 67 | Temp 98.6°F | Resp 18

## 2022-11-06 DIAGNOSIS — S161XXA Strain of muscle, fascia and tendon at neck level, initial encounter: Secondary | ICD-10-CM | POA: Diagnosis not present

## 2022-11-06 DIAGNOSIS — M25512 Pain in left shoulder: Secondary | ICD-10-CM

## 2022-11-06 MED ORDER — NAPROXEN 500 MG PO TABS: 500.0000 mg | ORAL_TABLET | Freq: Two times a day (BID) | ORAL | 0 refills | Status: AC

## 2022-11-06 MED ORDER — CYCLOBENZAPRINE HCL 5 MG PO TABS: 5.0000 mg | ORAL_TABLET | Freq: Every evening | ORAL | 0 refills | Status: AC | PRN
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
## 2022-11-10 LAB — CBC
HCT: 43 % (ref 35.0–45.0)
Hemoglobin: 14.1 g/dL (ref 11.7–15.5)
MCH: 31.3 pg (ref 27.0–33.0)
MCHC: 32.8 g/dL (ref 32.0–36.0)
MCV: 95.6 fL (ref 80.0–100.0)
MPV: 10.1 fL (ref 7.5–12.5)
Platelets: 423 10*3/uL — ABNORMAL HIGH (ref 140–400)
RBC: 4.5 10*6/uL (ref 3.80–5.10)
RDW: 11.7 % (ref 11.0–15.0)
WBC: 6.3 10*3/uL (ref 3.8–10.8)

## 2022-11-10 LAB — LIPID PANEL
Cholesterol: 236 mg/dL — ABNORMAL HIGH (ref <200)
HDL: 56 mg/dL (ref >=50)
LDL Cholesterol (Calc): 158 mg/dL — ABNORMAL HIGH
Non-HDL Cholesterol (Calc): 180 mg/dL — ABNORMAL HIGH (ref <130)
Total CHOL/HDL Ratio: 4.2 (calc) (ref <5.0)
Triglycerides: 106 mg/dL (ref <150)

## 2022-11-10 LAB — TESTOS,TOTAL,FREE AND SHBG (FEMALE)
Free Testosterone: 6.4 pg/mL (ref 0.1–6.4)
Sex Hormone Binding: 24.5 nmol/L (ref 17–124)
Testosterone, Total, LC-MS-MS: 37 ng/dL (ref 2–45)

## 2022-11-10 LAB — HEMOGLOBIN A1C W/OUT EAG: Hgb A1c MFr Bld: 5.5 %{Hb} (ref <5.7)

## 2022-11-11 LAB — CYTOLOGY - PAP
Comment: NEGATIVE
Comment: NEGATIVE
Comment: NEGATIVE
Diagnosis: NEGATIVE
HPV 16: NEGATIVE
HPV 18 / 45: NEGATIVE
High risk HPV: POSITIVE — AB

## 2022-11-18 ENCOUNTER — Other Ambulatory Visit: Payer: Self-pay

## 2022-11-18 DIAGNOSIS — R87810 Cervical high risk human papillomavirus (HPV) DNA test positive: Secondary | ICD-10-CM

## 2022-12-02 ENCOUNTER — Ambulatory Visit (INDEPENDENT_AMBULATORY_CARE_PROVIDER_SITE_OTHER): Payer: BC Managed Care – PPO | Admitting: Obstetrics and Gynecology

## 2022-12-02 ENCOUNTER — Encounter: Payer: Self-pay | Admitting: Obstetrics and Gynecology

## 2022-12-02 ENCOUNTER — Other Ambulatory Visit (HOSPITAL_COMMUNITY)
Admission: RE | Admit: 2022-12-02 | Discharge: 2022-12-02 | Disposition: A | Discharge status: Home / Self Care | Payer: BC Managed Care – PPO | Source: Ambulatory Visit | Attending: Obstetrics and Gynecology | Admitting: Obstetrics and Gynecology

## 2022-12-02 VITALS — BP 114/76 | HR 84 | Wt 246.0 lb

## 2022-12-02 DIAGNOSIS — R87810 Cervical high risk human papillomavirus (HPV) DNA test positive: Secondary | ICD-10-CM | POA: Diagnosis not present

## 2022-12-02 DIAGNOSIS — F411 Generalized anxiety disorder: Secondary | ICD-10-CM | POA: Diagnosis not present

## 2022-12-02 DIAGNOSIS — F3340 Major depressive disorder, recurrent, in remission, unspecified: Secondary | ICD-10-CM | POA: Diagnosis not present

## 2022-12-02 DIAGNOSIS — N888 Other specified noninflammatory disorders of cervix uteri: Secondary | ICD-10-CM | POA: Diagnosis not present

## 2022-12-02 NOTE — Patient Instructions (Signed)
It is common to have vaginal bleeding and cramping for up to 72 hours after your biopsy. Please call our office with heavy vaginal bleeding, severe abdominal pain or fever. Avoid intercourse, tampon use, douching and baths for 7 days to decrease the risk of infection.

## 2022-12-02 NOTE — Assessment & Plan Note (Addendum)
Atarax for PRN anxiety, can also use nightly Only use a few times. Sleep significantly improved If sleep symptoms not improved, consider trazadone

## 2022-12-02 NOTE — Assessment & Plan Note (Addendum)
Doing well on celexa 20mg  every day. Sleeping better and less anxious. Minimal side effects Psych referral placed due to hx of ADHD as well
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
## 2022-12-03 ENCOUNTER — Ambulatory Visit: Payer: BC Managed Care – PPO | Admitting: Obstetrics and Gynecology

## 2022-12-04 LAB — SURGICAL PATHOLOGY

## 2023-01-02 ENCOUNTER — Other Ambulatory Visit: Payer: Self-pay | Admitting: Obstetrics and Gynecology

## 2023-01-02 DIAGNOSIS — F3340 Major depressive disorder, recurrent, in remission, unspecified: Secondary | ICD-10-CM

## 2023-01-02 DIAGNOSIS — F411 Generalized anxiety disorder: Secondary | ICD-10-CM

## 2023-01-04 NOTE — Telephone Encounter (Signed)
Med refill request: Celexa Last AEX: 11/05/2022 Next AEX: not yet scheduled Last MMG (if hormonal med) n/a Refill authorized: Last Rx was sent #30 with 11 refills. Please approve or deny as appropriate.

## 2023-04-05 DIAGNOSIS — J029 Acute pharyngitis, unspecified: Secondary | ICD-10-CM | POA: Diagnosis not present

## 2023-04-09 DIAGNOSIS — B349 Viral infection, unspecified: Secondary | ICD-10-CM | POA: Diagnosis not present

## 2023-10-02 ENCOUNTER — Other Ambulatory Visit: Payer: Self-pay | Admitting: Obstetrics and Gynecology

## 2023-10-02 DIAGNOSIS — Z3009 Encounter for other general counseling and advice on contraception: Secondary | ICD-10-CM

## 2023-10-04 NOTE — Telephone Encounter (Signed)
.  Med refill request: Alesse  Last AEX: 11/05/22 Next AEX: Not scheduled sent a message to get an appt sent up  Last MMG (if hormonal med) NA Refill authorized: Please Advise?

## 2023-10-04 NOTE — Telephone Encounter (Signed)
 3 month supply provided. Pls schedule annual.

## 2023-12-31 ENCOUNTER — Other Ambulatory Visit: Payer: Self-pay | Admitting: Obstetrics and Gynecology

## 2023-12-31 DIAGNOSIS — Z3009 Encounter for other general counseling and advice on contraception: Secondary | ICD-10-CM

## 2023-12-31 NOTE — Telephone Encounter (Signed)
 Med refill request:   levonorgestrel -ethinyl estradiol  (VIENVA) 0.1-20 MG-MCG tablet  Start:  10/04/23 Disp: 84  tablets Refills:  0  Last AEX:  11/05/22 Next AEX:  Not yet scheduled Last MMG (if hormonal med):  N/A Refill authorized? Please Advise.

## 2024-01-07 ENCOUNTER — Other Ambulatory Visit: Payer: Self-pay | Admitting: Obstetrics and Gynecology

## 2024-01-07 DIAGNOSIS — F3340 Major depressive disorder, recurrent, in remission, unspecified: Secondary | ICD-10-CM

## 2024-01-07 DIAGNOSIS — F411 Generalized anxiety disorder: Secondary | ICD-10-CM

## 2024-01-07 NOTE — Telephone Encounter (Signed)
 Med refill request: citalopram   Last AEX: 11/05/22 Next AEX: not scheduled, attempt to schedule multiple times since Sep 2025. Last MMG (if hormonal med) Refill authorized: Rx refused, pt needs appointment. Routing to provider for review.

## 2024-01-10 MED ORDER — CITALOPRAM HYDROBROMIDE 20 MG PO TABS: 20.0000 mg | ORAL_TABLET | Freq: Every day | ORAL | 0 refills | Status: AC

## 2024-02-04 ENCOUNTER — Other Ambulatory Visit: Payer: Self-pay | Admitting: Obstetrics and Gynecology

## 2024-02-04 DIAGNOSIS — F3340 Major depressive disorder, recurrent, in remission, unspecified: Secondary | ICD-10-CM

## 2024-02-04 DIAGNOSIS — F411 Generalized anxiety disorder: Secondary | ICD-10-CM

## 2024-02-04 NOTE — Telephone Encounter (Signed)
 Med refill request: Citalopram  (Celexa ) 20 mg tablet Last AEX: 11/05/22 GH Next AEX: not scheduled, sent message to front to schedule pt for annual visit Last MMG (if hormonal med)  Refill authorized: Please Advise? Last Rx sent #30 with zero refills on 01/10/24 [facility]

## 2024-02-04 NOTE — Telephone Encounter (Signed)
 Patient needs an appt.
